# Patient Record
Sex: Female | Born: 1935 | Race: White | Hispanic: No | Smoking: Former smoker
Health system: Southern US, Community
[De-identification: ages and names within clinical notes are randomized; demographics above are authoritative.]

## PROBLEM LIST (undated history)

## (undated) DIAGNOSIS — D649 Anemia, unspecified: Secondary | ICD-10-CM

## (undated) DIAGNOSIS — J310 Chronic rhinitis: Secondary | ICD-10-CM

## (undated) DIAGNOSIS — I10 Essential (primary) hypertension: Secondary | ICD-10-CM

## (undated) DIAGNOSIS — F0390 Unspecified dementia without behavioral disturbance: Secondary | ICD-10-CM

## (undated) DIAGNOSIS — R5383 Other fatigue: Secondary | ICD-10-CM

## (undated) DIAGNOSIS — K589 Irritable bowel syndrome without diarrhea: Secondary | ICD-10-CM

## (undated) DIAGNOSIS — K449 Diaphragmatic hernia without obstruction or gangrene: Secondary | ICD-10-CM

## (undated) DIAGNOSIS — Z853 Personal history of malignant neoplasm of breast: Secondary | ICD-10-CM

## (undated) DIAGNOSIS — F419 Anxiety disorder, unspecified: Secondary | ICD-10-CM

## (undated) DIAGNOSIS — M545 Low back pain, unspecified: Secondary | ICD-10-CM

## (undated) DIAGNOSIS — K5730 Diverticulosis of large intestine without perforation or abscess without bleeding: Secondary | ICD-10-CM

## (undated) DIAGNOSIS — I872 Venous insufficiency (chronic) (peripheral): Secondary | ICD-10-CM

## (undated) DIAGNOSIS — M5030 Other cervical disc degeneration, unspecified cervical region: Secondary | ICD-10-CM

## (undated) DIAGNOSIS — R51 Headache: Secondary | ICD-10-CM

## (undated) DIAGNOSIS — M8580 Other specified disorders of bone density and structure, unspecified site: Secondary | ICD-10-CM

## (undated) HISTORY — DX: Irritable bowel syndrome, unspecified: K58.9

## (undated) HISTORY — PX: OTHER SURGICAL HISTORY: SHX169

## (undated) HISTORY — DX: Diaphragmatic hernia without obstruction or gangrene: K44.9

## (undated) HISTORY — DX: Diverticulosis of large intestine without perforation or abscess without bleeding: K57.30

## (undated) HISTORY — DX: Chronic rhinitis: J31.0

## (undated) HISTORY — DX: Low back pain: M54.5

## (undated) HISTORY — DX: Venous insufficiency (chronic) (peripheral): I87.2

## (undated) HISTORY — DX: Personal history of malignant neoplasm of breast: Z85.3

## (undated) HISTORY — DX: Headache: R51

## (undated) HISTORY — DX: Essential (primary) hypertension: I10

## (undated) HISTORY — DX: Low back pain, unspecified: M54.50

## (undated) HISTORY — DX: Other specified disorders of bone density and structure, unspecified site: M85.80

## (undated) HISTORY — DX: Other fatigue: R53.83

## (undated) HISTORY — DX: Anxiety disorder, unspecified: F41.9

---
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
## 1987-02-11 HISTORY — PX: LUMBAR LAMINECTOMY: SHX95

## 1990-02-10 HISTORY — PX: LAPAROSCOPIC CHOLECYSTECTOMY: SUR755

## 1997-08-31 ENCOUNTER — Ambulatory Visit (HOSPITAL_COMMUNITY): Admission: RE | Admit: 1997-08-31 | Discharge: 1997-08-31 | Payer: Self-pay | Admitting: Obstetrics & Gynecology

## 1997-10-03 ENCOUNTER — Other Ambulatory Visit: Admission: RE | Admit: 1997-10-03 | Discharge: 1997-10-03 | Payer: Self-pay | Admitting: Obstetrics & Gynecology

## 1998-10-04 ENCOUNTER — Encounter: Payer: Self-pay | Admitting: Internal Medicine

## 1998-10-04 ENCOUNTER — Ambulatory Visit (HOSPITAL_COMMUNITY): Admission: RE | Admit: 1998-10-04 | Discharge: 1998-10-04 | Payer: Self-pay | Admitting: Internal Medicine

## 2000-01-14 ENCOUNTER — Ambulatory Visit (HOSPITAL_COMMUNITY): Admission: RE | Admit: 2000-01-14 | Discharge: 2000-01-14 | Payer: Self-pay | Admitting: Obstetrics & Gynecology

## 2000-01-14 ENCOUNTER — Encounter: Payer: Self-pay | Admitting: Obstetrics & Gynecology

## 2000-01-22 ENCOUNTER — Other Ambulatory Visit: Admission: RE | Admit: 2000-01-22 | Discharge: 2000-01-22 | Payer: Self-pay | Admitting: Obstetrics & Gynecology

## 2001-03-22 ENCOUNTER — Encounter: Payer: Self-pay | Admitting: Obstetrics & Gynecology

## 2001-03-22 ENCOUNTER — Ambulatory Visit (HOSPITAL_COMMUNITY): Admission: RE | Admit: 2001-03-22 | Discharge: 2001-03-22 | Payer: Self-pay | Admitting: Obstetrics & Gynecology

## 2001-04-12 ENCOUNTER — Other Ambulatory Visit: Admission: RE | Admit: 2001-04-12 | Discharge: 2001-04-12 | Payer: Self-pay | Admitting: Obstetrics & Gynecology

## 2002-09-08 ENCOUNTER — Encounter: Payer: Self-pay | Admitting: Obstetrics & Gynecology

## 2002-09-08 ENCOUNTER — Ambulatory Visit (HOSPITAL_COMMUNITY): Admission: RE | Admit: 2002-09-08 | Discharge: 2002-09-08 | Payer: Self-pay | Admitting: Obstetrics & Gynecology

## 2002-09-27 ENCOUNTER — Encounter: Payer: Self-pay | Admitting: Pulmonary Disease

## 2002-09-27 ENCOUNTER — Ambulatory Visit (HOSPITAL_COMMUNITY): Admission: RE | Admit: 2002-09-27 | Discharge: 2002-09-27 | Payer: Self-pay | Admitting: Pulmonary Disease

## 2002-12-19 ENCOUNTER — Other Ambulatory Visit: Admission: RE | Admit: 2002-12-19 | Discharge: 2002-12-19 | Payer: Self-pay | Admitting: Obstetrics & Gynecology

## 2003-10-27 ENCOUNTER — Encounter: Admission: RE | Admit: 2003-10-27 | Discharge: 2003-10-27 | Payer: Self-pay | Admitting: Otolaryngology

## 2003-12-27 ENCOUNTER — Ambulatory Visit: Payer: Self-pay | Admitting: Gastroenterology

## 2003-12-27 ENCOUNTER — Ambulatory Visit: Payer: Self-pay | Admitting: Pulmonary Disease

## 2003-12-27 ENCOUNTER — Inpatient Hospital Stay (HOSPITAL_COMMUNITY): Admission: EM | Admit: 2003-12-27 | Discharge: 2004-01-01 | Payer: Self-pay | Admitting: Emergency Medicine

## 2004-01-08 ENCOUNTER — Ambulatory Visit: Payer: Self-pay | Admitting: Pulmonary Disease

## 2004-02-20 ENCOUNTER — Ambulatory Visit: Payer: Self-pay | Admitting: Pulmonary Disease

## 2004-02-20 ENCOUNTER — Other Ambulatory Visit: Admission: RE | Admit: 2004-02-20 | Discharge: 2004-02-20 | Payer: Self-pay | Admitting: Obstetrics & Gynecology

## 2004-05-01 ENCOUNTER — Ambulatory Visit: Payer: Self-pay | Admitting: Pulmonary Disease

## 2004-09-09 ENCOUNTER — Ambulatory Visit: Payer: Self-pay | Admitting: Pulmonary Disease

## 2004-11-28 ENCOUNTER — Ambulatory Visit: Payer: Self-pay | Admitting: Pulmonary Disease

## 2004-12-02 ENCOUNTER — Ambulatory Visit: Payer: Self-pay | Admitting: Cardiology

## 2004-12-18 ENCOUNTER — Ambulatory Visit (HOSPITAL_COMMUNITY): Admission: RE | Admit: 2004-12-18 | Discharge: 2004-12-18 | Payer: Self-pay | Admitting: Gastroenterology

## 2005-01-07 ENCOUNTER — Ambulatory Visit: Payer: Self-pay | Admitting: Pulmonary Disease

## 2005-04-29 ENCOUNTER — Ambulatory Visit (HOSPITAL_COMMUNITY): Admission: RE | Admit: 2005-04-29 | Discharge: 2005-04-29 | Payer: Self-pay | Admitting: Obstetrics & Gynecology

## 2005-05-14 ENCOUNTER — Encounter (INDEPENDENT_AMBULATORY_CARE_PROVIDER_SITE_OTHER): Payer: Self-pay | Admitting: Specialist

## 2005-05-14 ENCOUNTER — Encounter (INDEPENDENT_AMBULATORY_CARE_PROVIDER_SITE_OTHER): Payer: Self-pay | Admitting: Diagnostic Radiology

## 2005-05-14 ENCOUNTER — Encounter: Admission: RE | Admit: 2005-05-14 | Discharge: 2005-05-14 | Payer: Self-pay | Admitting: Obstetrics & Gynecology

## 2005-05-27 ENCOUNTER — Encounter: Admission: RE | Admit: 2005-05-27 | Discharge: 2005-05-27 | Payer: Self-pay | Admitting: Family Medicine

## 2005-06-10 HISTORY — PX: OTHER SURGICAL HISTORY: SHX169

## 2005-06-25 ENCOUNTER — Encounter: Admission: RE | Admit: 2005-06-25 | Discharge: 2005-06-25 | Payer: Self-pay | Admitting: Surgery

## 2005-06-26 ENCOUNTER — Encounter (INDEPENDENT_AMBULATORY_CARE_PROVIDER_SITE_OTHER): Payer: Self-pay | Admitting: *Deleted

## 2005-06-26 ENCOUNTER — Encounter: Admission: RE | Admit: 2005-06-26 | Discharge: 2005-06-26 | Payer: Self-pay | Admitting: Surgery

## 2005-06-26 ENCOUNTER — Ambulatory Visit (HOSPITAL_BASED_OUTPATIENT_CLINIC_OR_DEPARTMENT_OTHER): Admission: RE | Admit: 2005-06-26 | Discharge: 2005-06-26 | Payer: Self-pay | Admitting: Surgery

## 2005-06-26 ENCOUNTER — Encounter (INDEPENDENT_AMBULATORY_CARE_PROVIDER_SITE_OTHER): Payer: Self-pay | Admitting: Surgery

## 2005-06-27 ENCOUNTER — Ambulatory Visit: Payer: Self-pay | Admitting: Oncology

## 2005-06-27 ENCOUNTER — Ambulatory Visit: Admission: RE | Admit: 2005-06-27 | Discharge: 2005-08-15 | Payer: Self-pay | Admitting: *Deleted

## 2005-07-16 LAB — CBC WITH DIFFERENTIAL/PLATELET
Basophils Absolute: 0 10*3/uL (ref 0.0–0.1)
EOS%: 8.5 % — ABNORMAL HIGH (ref 0.0–7.0)
Eosinophils Absolute: 0.8 10*3/uL — ABNORMAL HIGH (ref 0.0–0.5)
HGB: 13.1 g/dL (ref 11.6–15.9)
MCH: 31.1 pg (ref 26.0–34.0)
NEUT#: 4.8 10*3/uL (ref 1.5–6.5)
RBC: 4.23 10*6/uL (ref 3.70–5.32)
RDW: 12.8 % (ref 11.3–14.5)
lymph#: 2.8 10*3/uL (ref 0.9–3.3)

## 2005-07-16 LAB — COMPREHENSIVE METABOLIC PANEL
AST: 20 U/L (ref 0–37)
Albumin: 4 g/dL (ref 3.5–5.2)
Alkaline Phosphatase: 61 U/L (ref 39–117)
BUN: 7 mg/dL (ref 6–23)
Calcium: 9.1 mg/dL (ref 8.4–10.5)
Chloride: 105 mEq/L (ref 96–112)
Potassium: 4 mEq/L (ref 3.5–5.3)
Sodium: 139 mEq/L (ref 135–145)
Total Protein: 6.9 g/dL (ref 6.0–8.3)

## 2005-07-16 LAB — CANCER ANTIGEN 27.29: CA 27.29: 15 U/mL (ref 0–39)

## 2005-08-12 ENCOUNTER — Encounter: Admission: RE | Admit: 2005-08-12 | Discharge: 2005-08-12 | Payer: Self-pay | Admitting: Oncology

## 2005-08-18 ENCOUNTER — Ambulatory Visit: Payer: Self-pay | Admitting: Pulmonary Disease

## 2005-09-01 ENCOUNTER — Ambulatory Visit: Payer: Self-pay | Admitting: Oncology

## 2005-10-29 ENCOUNTER — Ambulatory Visit: Payer: Self-pay | Admitting: Pulmonary Disease

## 2005-10-29 ENCOUNTER — Ambulatory Visit (HOSPITAL_COMMUNITY): Admission: RE | Admit: 2005-10-29 | Discharge: 2005-10-29 | Payer: Self-pay | Admitting: Pulmonary Disease

## 2005-11-03 ENCOUNTER — Encounter: Admission: RE | Admit: 2005-11-03 | Discharge: 2005-11-03 | Payer: Self-pay | Admitting: Pulmonary Disease

## 2005-11-13 ENCOUNTER — Ambulatory Visit (HOSPITAL_COMMUNITY): Admission: RE | Admit: 2005-11-13 | Discharge: 2005-11-13 | Payer: Self-pay | Admitting: Neurosurgery

## 2005-11-13 ENCOUNTER — Encounter: Payer: Self-pay | Admitting: Vascular Surgery

## 2005-11-25 ENCOUNTER — Ambulatory Visit: Payer: Self-pay | Admitting: Oncology

## 2005-11-28 ENCOUNTER — Ambulatory Visit: Payer: Self-pay | Admitting: Pulmonary Disease

## 2006-01-20 ENCOUNTER — Ambulatory Visit: Payer: Self-pay | Admitting: Pulmonary Disease

## 2006-02-24 ENCOUNTER — Ambulatory Visit: Payer: Self-pay | Admitting: Oncology

## 2006-02-27 LAB — CBC WITH DIFFERENTIAL/PLATELET
BASO%: 0.4 % (ref 0.0–2.0)
Basophils Absolute: 0 10*3/uL (ref 0.0–0.1)
HCT: 37.5 % (ref 34.8–46.6)
HGB: 12.7 g/dL (ref 11.6–15.9)
MONO#: 0.8 10*3/uL (ref 0.1–0.9)
NEUT%: 62.5 % (ref 39.6–76.8)
RDW: 12.2 % (ref 11.3–14.5)
WBC: 11.8 10*3/uL — ABNORMAL HIGH (ref 3.9–10.0)
lymph#: 3.2 10*3/uL (ref 0.9–3.3)

## 2006-02-27 LAB — LACTATE DEHYDROGENASE: LDH: 154 U/L (ref 94–250)

## 2006-02-27 LAB — COMPREHENSIVE METABOLIC PANEL
ALT: 13 U/L (ref 0–35)
Albumin: 3.8 g/dL (ref 3.5–5.2)
BUN: 10 mg/dL (ref 6–23)
CO2: 21 mEq/L (ref 19–32)
Calcium: 8.8 mg/dL (ref 8.4–10.5)
Chloride: 105 mEq/L (ref 96–112)
Creatinine, Ser: 0.87 mg/dL (ref 0.40–1.20)
Potassium: 4 mEq/L (ref 3.5–5.3)

## 2006-05-04 ENCOUNTER — Encounter: Admission: RE | Admit: 2006-05-04 | Discharge: 2006-05-04 | Payer: Self-pay | Admitting: Surgery

## 2006-06-12 ENCOUNTER — Ambulatory Visit: Payer: Self-pay | Admitting: Pulmonary Disease

## 2006-06-24 ENCOUNTER — Ambulatory Visit: Payer: Self-pay | Admitting: Oncology

## 2006-06-26 LAB — COMPREHENSIVE METABOLIC PANEL
ALT: 24 U/L (ref 0–35)
AST: 16 U/L (ref 0–37)
Albumin: 3.9 g/dL (ref 3.5–5.2)
Alkaline Phosphatase: 44 U/L (ref 39–117)
Glucose, Bld: 104 mg/dL — ABNORMAL HIGH (ref 70–99)
Potassium: 4 mEq/L (ref 3.5–5.3)
Sodium: 139 mEq/L (ref 135–145)
Total Bilirubin: 0.4 mg/dL (ref 0.3–1.2)
Total Protein: 6.6 g/dL (ref 6.0–8.3)

## 2006-06-26 LAB — CBC WITH DIFFERENTIAL/PLATELET
BASO%: 0.8 % (ref 0.0–2.0)
EOS%: 0.8 % (ref 0.0–7.0)
Eosinophils Absolute: 0.1 10*3/uL (ref 0.0–0.5)
LYMPH%: 26.2 % (ref 14.0–48.0)
MCHC: 34.6 g/dL (ref 32.0–36.0)
MCV: 89.7 fL (ref 81.0–101.0)
MONO%: 7 % (ref 0.0–13.0)
NEUT#: 9.2 10*3/uL — ABNORMAL HIGH (ref 1.5–6.5)
Platelets: 279 10*3/uL (ref 145–400)
RBC: 4.19 10*6/uL (ref 3.70–5.32)
RDW: 13.1 % (ref 11.3–14.5)

## 2006-06-26 LAB — CANCER ANTIGEN 27.29: CA 27.29: 14 U/mL (ref 0–39)

## 2006-11-24 ENCOUNTER — Ambulatory Visit: Payer: Self-pay | Admitting: Oncology

## 2007-02-16 ENCOUNTER — Ambulatory Visit: Payer: Self-pay | Admitting: Oncology

## 2007-02-18 ENCOUNTER — Encounter: Payer: Self-pay | Admitting: Pulmonary Disease

## 2007-02-18 LAB — COMPREHENSIVE METABOLIC PANEL
AST: 11 U/L (ref 0–37)
BUN: 8 mg/dL (ref 6–23)
CO2: 22 mEq/L (ref 19–32)
Calcium: 8.7 mg/dL (ref 8.4–10.5)
Chloride: 102 mEq/L (ref 96–112)
Creatinine, Ser: 0.89 mg/dL (ref 0.40–1.20)
Total Bilirubin: 0.3 mg/dL (ref 0.3–1.2)

## 2007-02-18 LAB — CBC WITH DIFFERENTIAL/PLATELET
BASO%: 0.6 % (ref 0.0–2.0)
Basophils Absolute: 0.1 10*3/uL (ref 0.0–0.1)
EOS%: 1 % (ref 0.0–7.0)
HCT: 39.9 % (ref 34.8–46.6)
HGB: 13.7 g/dL (ref 11.6–15.9)
LYMPH%: 25.9 % (ref 14.0–48.0)
MCH: 31.3 pg (ref 26.0–34.0)
MCHC: 34.3 g/dL (ref 32.0–36.0)
MCV: 91.4 fL (ref 81.0–101.0)
NEUT%: 68.1 % (ref 39.6–76.8)
Platelets: 368 10*3/uL (ref 145–400)
lymph#: 3.6 10*3/uL — ABNORMAL HIGH (ref 0.9–3.3)

## 2007-02-18 LAB — LACTATE DEHYDROGENASE: LDH: 168 U/L (ref 94–250)

## 2007-03-04 LAB — VITAMIN D PNL(25-HYDRXY+1,25-DIHY)-BLD
Vit D, 1,25-Dihydroxy: 51 pg/mL (ref 6–62)
Vit D, 25-Hydroxy: 18 ng/mL — ABNORMAL LOW (ref 30–89)

## 2007-03-05 DIAGNOSIS — M545 Low back pain: Secondary | ICD-10-CM

## 2007-03-05 DIAGNOSIS — I872 Venous insufficiency (chronic) (peripheral): Secondary | ICD-10-CM | POA: Insufficient documentation

## 2007-03-05 DIAGNOSIS — I10 Essential (primary) hypertension: Secondary | ICD-10-CM | POA: Insufficient documentation

## 2007-03-05 DIAGNOSIS — J45909 Unspecified asthma, uncomplicated: Secondary | ICD-10-CM | POA: Insufficient documentation

## 2007-04-02 ENCOUNTER — Ambulatory Visit: Payer: Self-pay | Admitting: Physical Medicine and Rehabilitation

## 2007-04-02 ENCOUNTER — Encounter
Admission: RE | Admit: 2007-04-02 | Discharge: 2007-07-01 | Payer: Self-pay | Admitting: Physical Medicine and Rehabilitation

## 2007-05-05 ENCOUNTER — Encounter: Admission: RE | Admit: 2007-05-05 | Discharge: 2007-05-05 | Payer: Self-pay | Admitting: Oncology

## 2007-05-05 ENCOUNTER — Ambulatory Visit: Payer: Self-pay | Admitting: Physical Medicine and Rehabilitation

## 2007-05-25 ENCOUNTER — Ambulatory Visit: Payer: Self-pay | Admitting: Pulmonary Disease

## 2007-06-07 ENCOUNTER — Ambulatory Visit: Payer: Self-pay | Admitting: Physical Medicine and Rehabilitation

## 2007-06-09 ENCOUNTER — Ambulatory Visit: Payer: Self-pay | Admitting: Oncology

## 2007-06-11 LAB — CBC WITH DIFFERENTIAL/PLATELET
Eosinophils Absolute: 0.2 10*3/uL (ref 0.0–0.5)
HCT: 40 % (ref 34.8–46.6)
LYMPH%: 29.3 % (ref 14.0–48.0)
MONO#: 0.7 10*3/uL (ref 0.1–0.9)
NEUT#: 6.2 10*3/uL (ref 1.5–6.5)
Platelets: 354 10*3/uL (ref 145–400)
RBC: 4.45 10*6/uL (ref 3.70–5.32)
WBC: 10.1 10*3/uL — ABNORMAL HIGH (ref 3.9–10.0)

## 2007-06-14 ENCOUNTER — Encounter
Admission: RE | Admit: 2007-06-14 | Discharge: 2007-07-08 | Payer: Self-pay | Admitting: Physical Medicine and Rehabilitation

## 2007-06-14 LAB — CANCER ANTIGEN 27.29: CA 27.29: 20 U/mL (ref 0–39)

## 2007-06-14 LAB — COMPREHENSIVE METABOLIC PANEL
Alkaline Phosphatase: 64 U/L (ref 39–117)
BUN: 8 mg/dL (ref 6–23)
Creatinine, Ser: 0.73 mg/dL (ref 0.40–1.20)
Glucose, Bld: 71 mg/dL (ref 70–99)
Total Bilirubin: 0.4 mg/dL (ref 0.3–1.2)

## 2007-06-14 LAB — VITAMIN D 25 HYDROXY (VIT D DEFICIENCY, FRACTURES): Vit D, 25-Hydroxy: 16 ng/mL — ABNORMAL LOW (ref 30–89)

## 2007-06-14 LAB — LACTATE DEHYDROGENASE: LDH: 187 U/L (ref 94–250)

## 2007-06-18 ENCOUNTER — Encounter: Payer: Self-pay | Admitting: Pulmonary Disease

## 2007-06-21 ENCOUNTER — Ambulatory Visit: Payer: Self-pay | Admitting: Physical Medicine and Rehabilitation

## 2007-07-08 ENCOUNTER — Encounter
Admission: RE | Admit: 2007-07-08 | Discharge: 2007-07-09 | Payer: Self-pay | Admitting: Physical Medicine & Rehabilitation

## 2007-07-09 ENCOUNTER — Ambulatory Visit: Payer: Self-pay | Admitting: Physical Medicine and Rehabilitation

## 2007-08-16 ENCOUNTER — Encounter: Admission: RE | Admit: 2007-08-16 | Discharge: 2007-08-16 | Payer: Self-pay | Admitting: Oncology

## 2007-11-04 IMAGING — CR DG CHEST 2V
2 series · 2 of 2 positions shown · non-contrast
Comparison: none

CLINICAL DATA: Pre-op right breast surgery.  Patient has some cough and congestion.
 CHEST - 2 VIEW:

[view not recorded (1 of 2)]
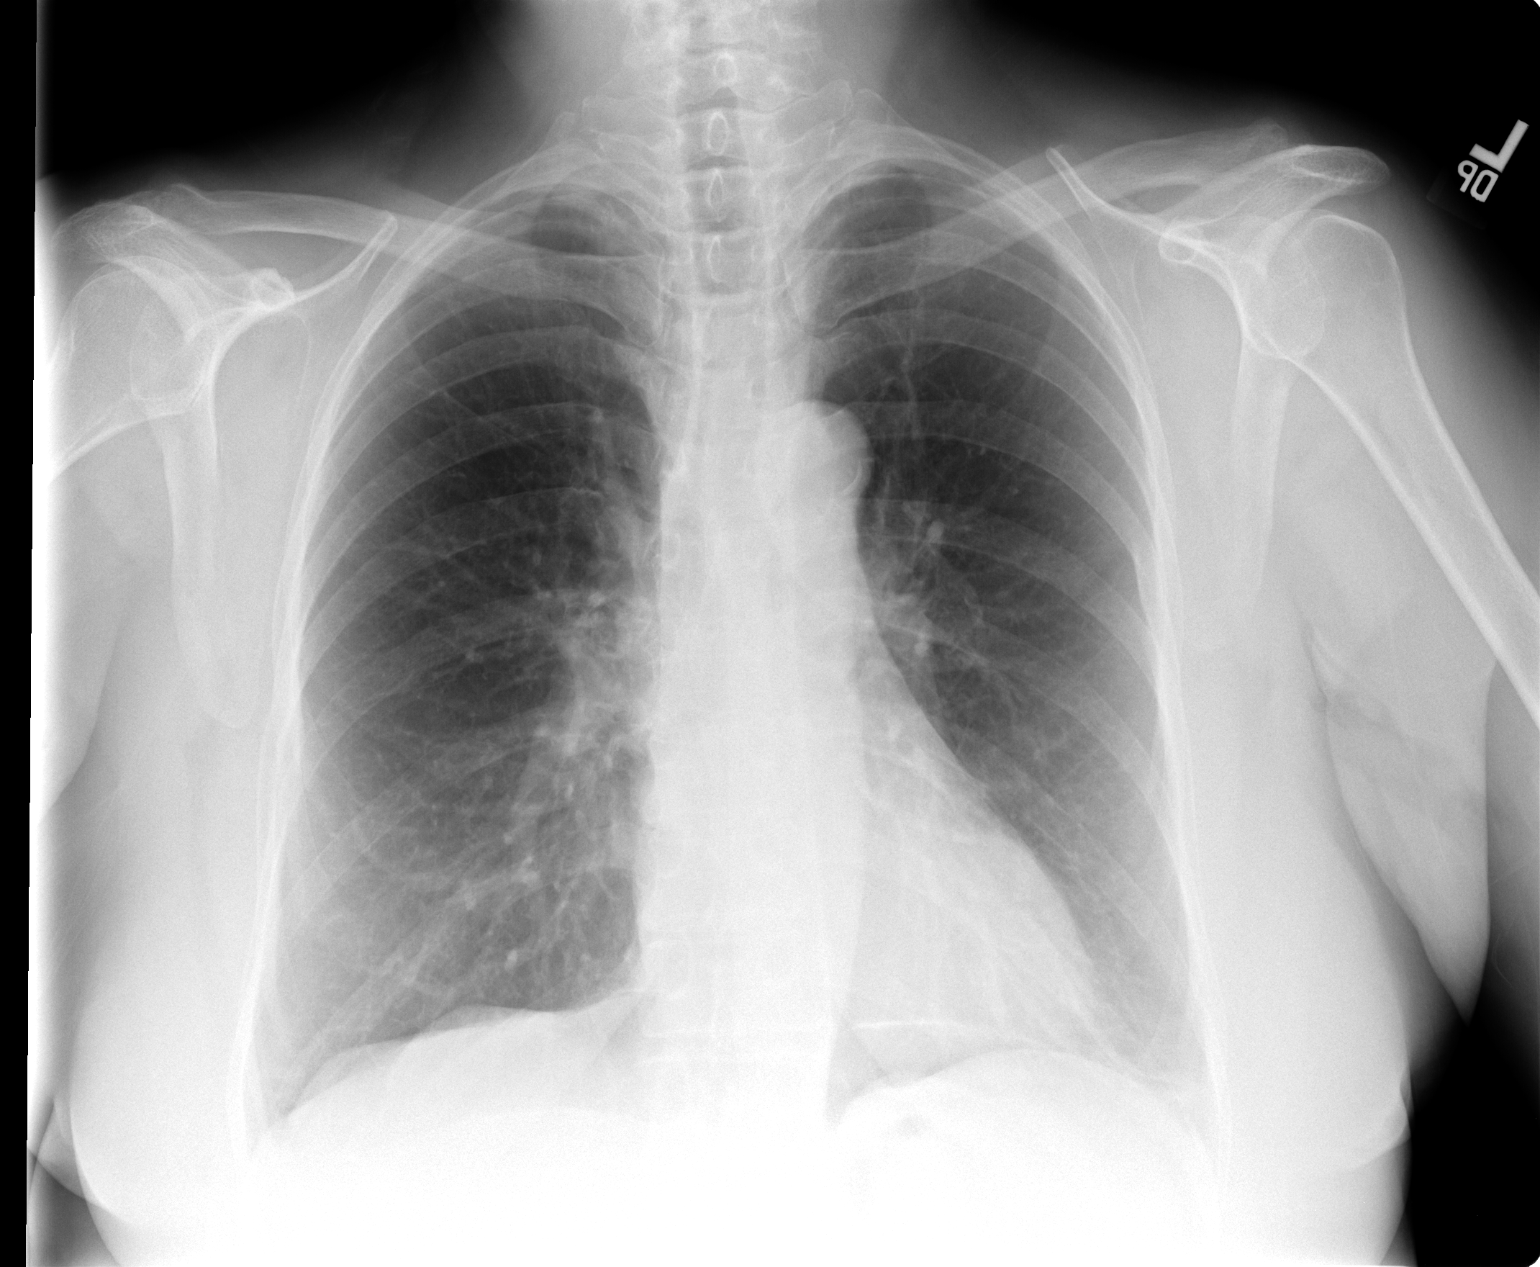

[view not recorded (2 of 2)]
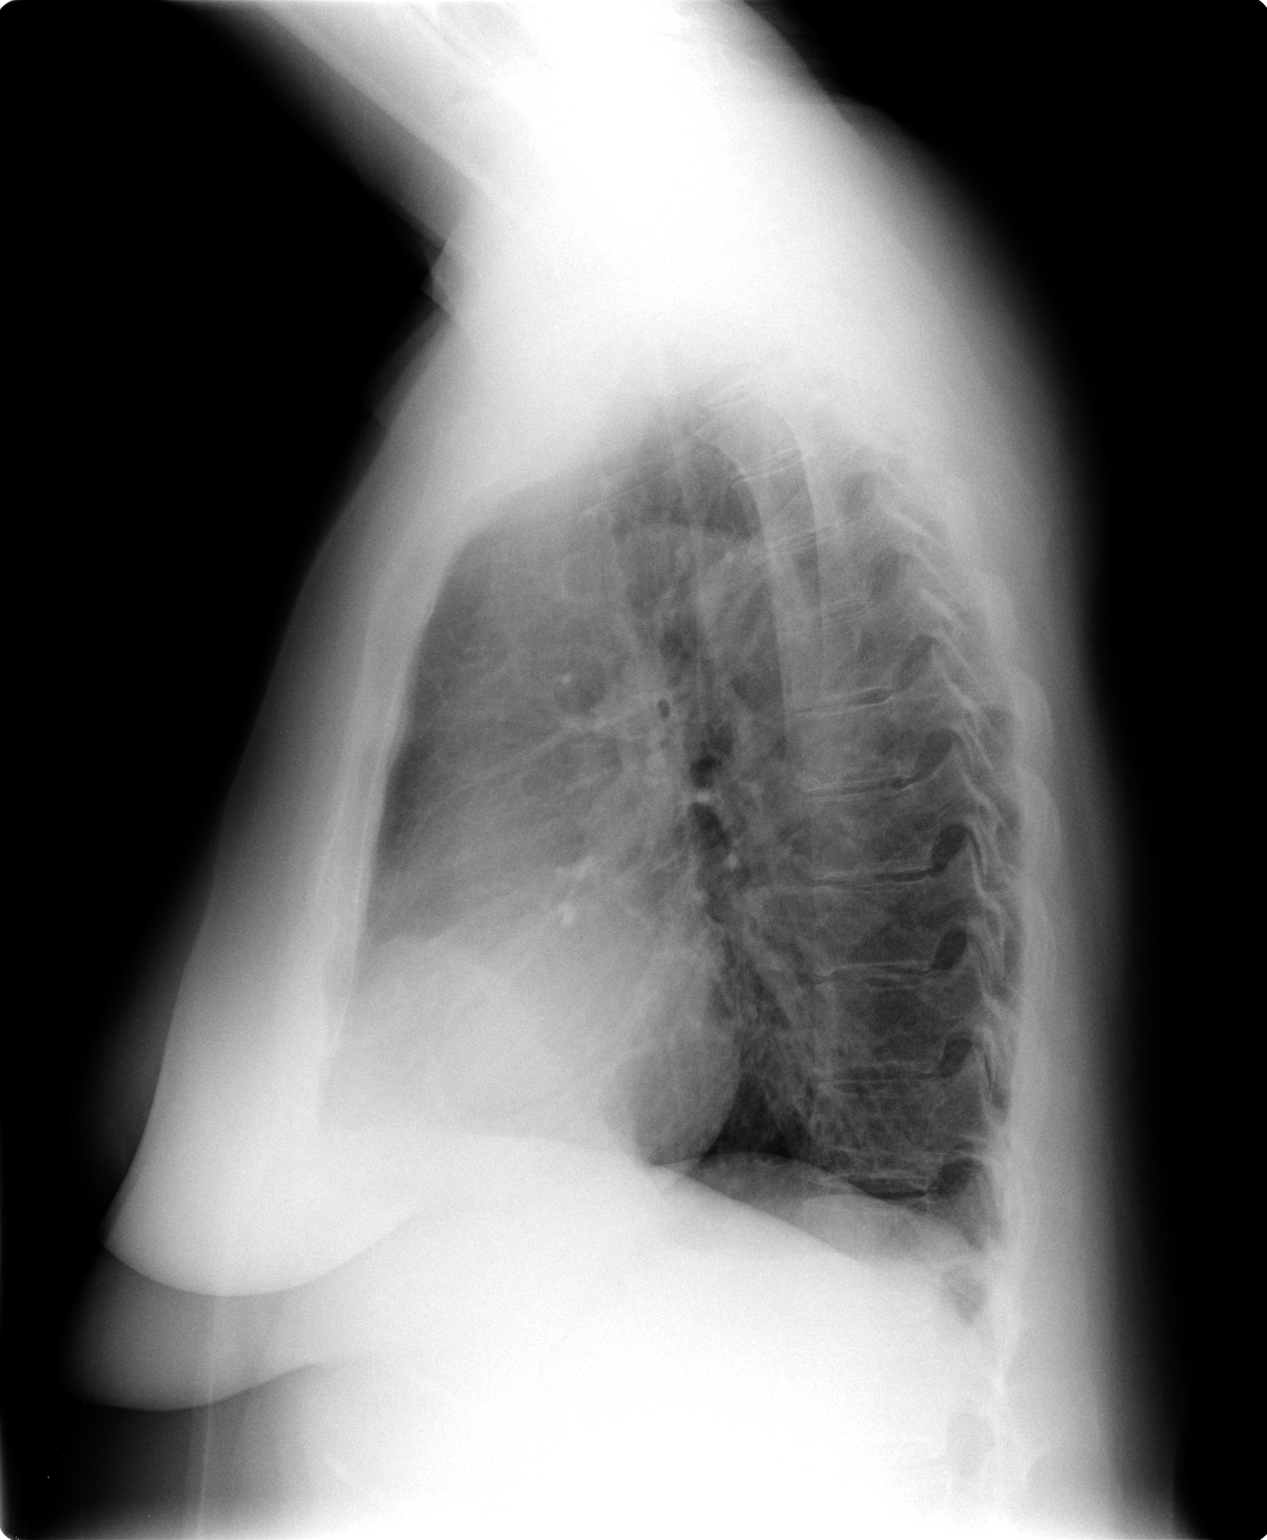

[2 of 2 positions shown; findings below may reference images not displayed]

FINDINGS: PA and lateral views of the chest are made and are compared to a previous study of 12/27/03 and show again mild diffuse peribronchial thickening, flattening of the hemidiaphragms, and some increase in the AP diameter of the chest.  There is also what appears to be a stable scar at the left lung base. The heart and mediastinum are within the normal limits.  The bony thorax appears normal.
IMPRESSION: Mild COPD.  Stable atelectasis and/or scarring left base as compared to previous study of 12/27/03.  No acute abnormality is seen.

## 2007-12-22 ENCOUNTER — Ambulatory Visit: Payer: Self-pay | Admitting: Oncology

## 2007-12-24 LAB — CBC WITH DIFFERENTIAL/PLATELET
BASO%: 1 % (ref 0.0–2.0)
Basophils Absolute: 0.1 10*3/uL (ref 0.0–0.1)
EOS%: 2.2 % (ref 0.0–7.0)
HGB: 13.2 g/dL (ref 11.6–15.9)
MCH: 30.1 pg (ref 26.0–34.0)
MCHC: 33.9 g/dL (ref 32.0–36.0)
MCV: 88.7 fL (ref 81.0–101.0)
MONO%: 5.7 % (ref 0.0–13.0)
NEUT%: 62.6 % (ref 39.6–76.8)
RDW: 13.1 % (ref 11.3–14.5)

## 2007-12-27 LAB — COMPREHENSIVE METABOLIC PANEL
ALT: 14 U/L (ref 0–35)
AST: 13 U/L (ref 0–37)
Creatinine, Ser: 0.8 mg/dL (ref 0.40–1.20)
Total Bilirubin: 0.4 mg/dL (ref 0.3–1.2)

## 2007-12-27 LAB — LACTATE DEHYDROGENASE: LDH: 195 U/L (ref 94–250)

## 2007-12-31 ENCOUNTER — Encounter: Payer: Self-pay | Admitting: Pulmonary Disease

## 2008-04-20 ENCOUNTER — Encounter: Payer: Self-pay | Admitting: Adult Health

## 2008-04-20 ENCOUNTER — Ambulatory Visit: Payer: Self-pay | Admitting: Pulmonary Disease

## 2008-04-20 DIAGNOSIS — J309 Allergic rhinitis, unspecified: Secondary | ICD-10-CM | POA: Insufficient documentation

## 2008-04-25 LAB — CONVERTED CEMR LAB
BUN: 9 mg/dL (ref 6–23)
Bacteria, UA: NEGATIVE
Basophils Absolute: 0.1 10*3/uL (ref 0.0–0.1)
Calcium: 9.2 mg/dL (ref 8.4–10.5)
Crystals: NEGATIVE
Eosinophils Absolute: 0.2 10*3/uL (ref 0.0–0.7)
Eosinophils Relative: 1.8 % (ref 0.0–5.0)
GFR calc Af Amer: 91 mL/min
Glucose, Bld: 78 mg/dL (ref 70–99)
HCT: 37.9 % (ref 36.0–46.0)
Hemoglobin, Urine: NEGATIVE
MCHC: 34.3 g/dL (ref 30.0–36.0)
MCV: 88.2 fL (ref 78.0–100.0)
Monocytes Absolute: 0.8 10*3/uL (ref 0.1–1.0)
Platelets: 347 10*3/uL (ref 150–400)
RDW: 12.2 % (ref 11.5–14.6)
Sodium: 142 meq/L (ref 135–145)
Urine Glucose: NEGATIVE mg/dL
Urobilinogen, UA: 0.2 (ref 0.0–1.0)
WBC: 11.6 10*3/uL — ABNORMAL HIGH (ref 4.5–10.5)

## 2008-05-11 ENCOUNTER — Encounter: Admission: RE | Admit: 2008-05-11 | Discharge: 2008-05-11 | Payer: Self-pay | Admitting: Oncology

## 2008-06-27 ENCOUNTER — Ambulatory Visit: Payer: Self-pay | Admitting: Oncology

## 2008-06-29 LAB — COMPREHENSIVE METABOLIC PANEL
ALT: 12 U/L (ref 0–35)
Albumin: 4 g/dL (ref 3.5–5.2)
CO2: 24 mEq/L (ref 19–32)
Chloride: 103 mEq/L (ref 96–112)
Glucose, Bld: 130 mg/dL — ABNORMAL HIGH (ref 70–99)
Potassium: 4.3 mEq/L (ref 3.5–5.3)
Sodium: 138 mEq/L (ref 135–145)
Total Bilirubin: 0.3 mg/dL (ref 0.3–1.2)
Total Protein: 6.8 g/dL (ref 6.0–8.3)

## 2008-06-29 LAB — CBC WITH DIFFERENTIAL/PLATELET
BASO%: 0.5 % (ref 0.0–2.0)
Eosinophils Absolute: 0.3 10*3/uL (ref 0.0–0.5)
LYMPH%: 27.2 % (ref 14.0–49.7)
MCHC: 33.9 g/dL (ref 31.5–36.0)
MONO#: 0.6 10*3/uL (ref 0.1–0.9)
NEUT#: 6.4 10*3/uL (ref 1.5–6.5)
RBC: 4.02 10*6/uL (ref 3.70–5.45)
RDW: 13.6 % (ref 11.2–14.5)
WBC: 10.1 10*3/uL (ref 3.9–10.3)
lymph#: 2.7 10*3/uL (ref 0.9–3.3)

## 2008-06-29 LAB — LACTATE DEHYDROGENASE: LDH: 194 U/L (ref 94–250)

## 2008-07-06 ENCOUNTER — Encounter: Payer: Self-pay | Admitting: Pulmonary Disease

## 2008-07-19 ENCOUNTER — Ambulatory Visit: Payer: Self-pay | Admitting: Pulmonary Disease

## 2008-07-19 DIAGNOSIS — C50919 Malignant neoplasm of unspecified site of unspecified female breast: Secondary | ICD-10-CM | POA: Insufficient documentation

## 2008-07-19 DIAGNOSIS — K5730 Diverticulosis of large intestine without perforation or abscess without bleeding: Secondary | ICD-10-CM | POA: Insufficient documentation

## 2008-07-19 DIAGNOSIS — K589 Irritable bowel syndrome without diarrhea: Secondary | ICD-10-CM

## 2008-07-19 DIAGNOSIS — K449 Diaphragmatic hernia without obstruction or gangrene: Secondary | ICD-10-CM | POA: Insufficient documentation

## 2008-12-29 ENCOUNTER — Ambulatory Visit: Payer: Self-pay | Admitting: Oncology

## 2009-10-09 ENCOUNTER — Ambulatory Visit: Payer: Self-pay | Admitting: Oncology

## 2009-10-17 ENCOUNTER — Inpatient Hospital Stay (HOSPITAL_COMMUNITY)
Admission: EM | Admit: 2009-10-17 | Discharge: 2009-10-26 | Payer: Self-pay | Source: Home / Self Care | Admitting: Emergency Medicine

## 2009-10-17 ENCOUNTER — Encounter: Payer: Self-pay | Admitting: Family Medicine

## 2009-10-18 ENCOUNTER — Ambulatory Visit: Payer: Self-pay | Admitting: Psychiatry

## 2009-10-19 ENCOUNTER — Encounter: Payer: Self-pay | Admitting: Family Medicine

## 2009-10-22 ENCOUNTER — Encounter: Payer: Self-pay | Admitting: Family Medicine

## 2009-10-24 ENCOUNTER — Encounter: Payer: Self-pay | Admitting: Family Medicine

## 2009-10-26 ENCOUNTER — Encounter: Payer: Self-pay | Admitting: Family Medicine

## 2009-11-09 ENCOUNTER — Encounter: Payer: Self-pay | Admitting: Family Medicine

## 2009-11-12 ENCOUNTER — Ambulatory Visit: Payer: Self-pay | Admitting: Family Medicine

## 2009-11-12 DIAGNOSIS — F068 Other specified mental disorders due to known physiological condition: Secondary | ICD-10-CM

## 2009-11-12 DIAGNOSIS — D518 Other vitamin B12 deficiency anemias: Secondary | ICD-10-CM

## 2009-11-12 DIAGNOSIS — D508 Other iron deficiency anemias: Secondary | ICD-10-CM | POA: Insufficient documentation

## 2009-11-13 ENCOUNTER — Telehealth (INDEPENDENT_AMBULATORY_CARE_PROVIDER_SITE_OTHER): Payer: Self-pay | Admitting: *Deleted

## 2009-11-13 LAB — CONVERTED CEMR LAB
Basophils Relative: 0.1 % (ref 0.0–3.0)
CO2: 24 meq/L (ref 19–32)
Chloride: 99 meq/L (ref 96–112)
Eosinophils Absolute: 0.2 10*3/uL (ref 0.0–0.7)
Hemoglobin: 11.6 g/dL — ABNORMAL LOW (ref 12.0–15.0)
Lymphocytes Relative: 27.1 % (ref 12.0–46.0)
MCHC: 32 g/dL (ref 30.0–36.0)
MCV: 73.9 fL — ABNORMAL LOW (ref 78.0–100.0)
Monocytes Absolute: 0.8 10*3/uL (ref 0.1–1.0)
Neutro Abs: 6.4 10*3/uL (ref 1.4–7.7)
RBC: 4.9 M/uL (ref 3.87–5.11)
Saturation Ratios: 6.9 % — ABNORMAL LOW (ref 20.0–50.0)
Sodium: 134 meq/L — ABNORMAL LOW (ref 135–145)

## 2009-11-14 ENCOUNTER — Ambulatory Visit: Payer: Self-pay | Admitting: Oncology

## 2009-11-30 ENCOUNTER — Encounter (INDEPENDENT_AMBULATORY_CARE_PROVIDER_SITE_OTHER): Payer: Self-pay | Admitting: *Deleted

## 2009-12-21 ENCOUNTER — Ambulatory Visit: Payer: Self-pay | Admitting: Oncology

## 2009-12-26 ENCOUNTER — Ambulatory Visit: Payer: Self-pay | Admitting: Psychiatry

## 2010-03-02 ENCOUNTER — Encounter: Payer: Self-pay | Admitting: Obstetrics & Gynecology

## 2010-03-03 ENCOUNTER — Encounter: Payer: Self-pay | Admitting: Pulmonary Disease

## 2010-03-03 ENCOUNTER — Encounter: Payer: Self-pay | Admitting: Oncology

## 2010-03-05 ENCOUNTER — Ambulatory Visit (HOSPITAL_COMMUNITY)
Admission: RE | Admit: 2010-03-05 | Discharge: 2010-03-05 | Payer: Self-pay | Source: Home / Self Care | Attending: Psychiatry | Admitting: Psychiatry

## 2010-03-14 NOTE — Miscellaneous (Signed)
Summary: [employer]  [employer]   Imported By: Lanelle Bal 11/19/2009 12:49:25  _____________________________________________________________________  External Attachment:    Type:   Image     Comment:   External Document

## 2010-03-14 NOTE — Letter (Signed)
Summary: [facility]  [facility]   Imported By: Lanelle Bal 11/19/2009 12:14:46  _____________________________________________________________________  External Attachment:    Type:   Image     Comment:   External Document

## 2010-03-14 NOTE — Letter (Signed)
Summary: Unable To Reach-Consult Scheduled  [facility] [place]  [address] [place], [place] [address]   Phone: [phone]  Fax: [phone]    11/30/2009 MRN: [id number]    Dear Ms. Daye,   We have been unable to reach you by phone.  There are some things we need to discuss from your most recent labs.  Please contact our office to discuss.    Thank you,  Dr. Jim Desanctis at [facility]

## 2010-03-14 NOTE — Letter (Signed)
Summary: [facility]  [facility]   Imported By: Lanelle Bal 11/19/2009 12:21:39  _____________________________________________________________________  External Attachment:    Type:   Image     Comment:   External Document

## 2010-03-14 NOTE — Assessment & Plan Note (Signed)
Summary: [name], DEMENTIA///SPH   Vital Signs:  Patient profile:   77 year old female Height:      66 inches Weight:      134.8 pounds BMI:     21.84 Pulse rate:   64 / minute Pulse rhythm:   regular BP sitting:   150 / 90  (left arm) Cuff size:   regular  Vitals Entered By: Almeta Monas CMA Duncan Dull) (November 12, 2009 2:07 PM) CC: hospt f/u Flu Vaccine Consent Questions     Do you have a history of severe allergic reactions to this vaccine? no    Any prior history of allergic reactions to egg and/or gelatin? no    Do you have a sensitivity to the preservative Thimersol? no    Do you have a past history of Guillan-Barre Syndrome? no    Do you currently have an acute febrile illness? no    Have you ever had a severe reaction to latex? no    Vaccine information given and explained to patient? yes    Are you currently pregnant? no    Lot Number:[id number]   Exp Date:08/10/2010   Site Given  Left Deltoid IM   History of Present Illness: Pt here to establish and hospital f/u.  Her daughter is with her.   Pt was in hospital with anemia , sob and fatigue.   Hospital records reviewed.    Current Medications (verified): 1)  Zyrtec Allergy 10 Mg Tabs (Cetirizine Hcl) .... Take 1 Tab By Mouth Once Daily in The Am... 2)  Flonase 50 Mcg/act Susp (Fluticasone Propionate) .... 2 Sp in Each Nostril At Bedtime.Marland KitchenMarland Kitchen 3)  Mucinex Maximum Strength 1200 Mg Xr12h-Tab (Guaifenesin) .... Take 1 Tab By Mouth Two Times A Day W/ Plenty of Fluids.Marland KitchenMarland Kitchen 4)  Advair Diskus 500-50 Mcg/dose Misc (Fluticasone-Salmeterol) .... Inhale 1 Puff Two Times A Day 5)  Lopressor 50 Mg  Tabs (Metoprolol Tartrate) .... Take 1/2 Tab By Mouth Two Times A Day 6)  Megestrol Acetate 40 Mg Tabs (Megestrol Acetate) .... Take 1 Tablet By Mouth Once A Day 7)  Caltrate 600+d 600-400 Mg-Unit Tabs (Calcium Carbonate-Vitamin D) .... Take 1 Tab By Mouth Two Times A Day... 8)  Womens Multivitamin Plus  Tabs (Multiple  Vitamins-Minerals) .... Take 1 Tab Daily.Marland KitchenMarland Kitchen 9)  Vitamin D 1000 Unit Caps (Cholecalciferol) .... Take 1 Cap By Mouth Once Daily... 10)  Protonix 40 Mg Solr (Pantoprazole Sodium) .... By Mouth Two Times A Day 11)  Alprazolam 0.25 Mg Tabs (Alprazolam) .Marland Kitchen.. 1 By Mouth Qhs 12)  Zoloft 50 Mg Tabs (Sertraline Hcl) .... 1/2 Tab By Mouth Once Daily For 8 Days Then 1 By Mouth Once Daily  Allergies (verified): 1)  ! Levaquin  Past History:  Past medical, surgical, family and social histories (including risk factors) reviewed for relevance to current acute and chronic problems.  Past Medical History: Reviewed history from 07/19/2008 and no changes required. ALLERGIC RHINITIS (ICD-477.9) CHRONIC RHINITIS (ICD-472.0) ASTHMA (ICD-493.90) HYPERTENSION (ICD-401.9) VENOUS INSUFFICIENCY (ICD-459.81) HIATAL HERNIA (ICD-553.3) IRRITABLE BOWEL SYNDROME (ICD-564.1) DIVERTICULOSIS OF COLON (ICD-562.10) Hx of BREAST CANCER (ICD-174.9) LOW BACK PAIN SYNDROME (ICD-724.2) OSTEOPENIA (ICD-733.90) FATIGUE (ICD-780.79) HEADACHE (ICD-784.0) ANXIETY (ICD-300.00)  Past Surgical History: Reviewed history from 07/19/2008 and no changes required. S/P appendectomy in the 1950's S/P right inguinal hernia repair S/P lumbar laminectomy in 1989 by DrAplington S/P lap cholecystectomy in 1992 by DrBowman S/P right breast lumpectomy & sentinel node bx 5/07 by DrStreck  Family History: Reviewed history and no changes  required.  Social History: Reviewed history and no changes required. lives at the [facility] living with her dogs   Review of Systems      See HPI  Physical Exam  General:  well-developed, well-nourished, and well-hydrated. pale  pleasantly mildly demented   Neck:  No deformities, masses, or tenderness noted. Lungs:  Normal respiratory effort, chest expands symmetrically. Lungs are clear to auscultation, no crackles or wheezes. Heart:  normal rate and Grade 1-2  /6 systolic ejection  murmur.   Abdomen:  Bowel sounds positive,abdomen soft and non-tender without masses, organomegaly or hernias noted. Psych:  moderately anxious, memory impairment, and judgment poor.     Impression & Recommendations:  Problem # 1:  DEMENTIA (ICD-294.8)  Orders: Venipuncture (14782) TLB-B12 + Folate Pnl (95621_30865-H84/ONG) TLB-IBC Pnl (Iron/FE;Transferrin) (83550-IBC) TLB-BMP (Basic Metabolic Panel-BMET) (80048-METABOL) TLB-CBC Platelet - w/Differential (85025-CBCD) T-Vitamin D (25-Hydroxy) (29528-41324) TLB-Ferritin (82728-FER) Psychiatric Referral (Psych) Specimen Handling (40102)  Problem # 2:  ANEMIA, IRON DEFICIENCY, MICROCYTIC (ICD-280.8)  Orders: Venipuncture (72536) TLB-B12 + Folate Pnl (64403_47425-Z56/LOV) TLB-IBC Pnl (Iron/FE;Transferrin) (83550-IBC) TLB-BMP (Basic Metabolic Panel-BMET) (80048-METABOL) TLB-CBC Platelet - w/Differential (85025-CBCD) T-Vitamin D (25-Hydroxy) (56433-29518) TLB-Ferritin (82728-FER) Gastroenterology Referral (GI) Specimen Handling (84166)  Problem # 3:  ANEMIA, B12 DEFICIENCY (ICD-281.1)  Orders: Venipuncture (06301) TLB-B12 + Folate Pnl (60109_32355-D32/KGU) TLB-IBC Pnl (Iron/FE;Transferrin) (83550-IBC) TLB-BMP (Basic Metabolic Panel-BMET) (80048-METABOL) TLB-CBC Platelet - w/Differential (85025-CBCD) T-Vitamin D (25-Hydroxy) (54270-62376) TLB-Ferritin (82728-FER) Specimen Handling (28315) Admin of Therapeutic Inj  intramuscular or subcutaneous (17616) Vit B12 1000 mcg (J3420)  Hgb: 13.0 (04/20/2008)   Hct: 37.9 (04/20/2008)   Platelets: 347 (04/20/2008) RBC: 4.29 (04/20/2008)   RDW: 12.2 (04/20/2008)   WBC: 11.6 (04/20/2008) MCV: 88.2 (04/20/2008)   MCHC: 34.3 (04/20/2008) TSH: 1.23 (04/20/2008)  Problem # 4:  HYPERTENSION (ICD-401.9)  Her updated medication list for this problem includes:    Lopressor 50 Mg Tabs (Metoprolol tartrate) .Marland Kitchen... Take 1/2 tab by mouth two times a day  Orders: Venipuncture (07371) TLB-B12  + Folate Pnl (06269_48546-E70/JJK) TLB-IBC Pnl (Iron/FE;Transferrin) (83550-IBC) TLB-BMP (Basic Metabolic Panel-BMET) (80048-METABOL) TLB-CBC Platelet - w/Differential (85025-CBCD) T-Vitamin D (25-Hydroxy) (09381-82993) TLB-Ferritin (82728-FER) Specimen Handling (71696)  BP today: 150/90 Prior BP: 122/80 (07/19/2008)  Labs Reviewed: K+: 4.0 (04/20/2008) Creat: : 0.8 (04/20/2008)     Complete Medication List: 1)  Zyrtec Allergy 10 Mg Tabs (Cetirizine hcl) .... Take 1 tab by mouth once daily in the am... 2)  Flonase 50 Mcg/act Susp (Fluticasone propionate) .... 2 sp in each nostril at bedtime.Marland KitchenMarland Kitchen 3)  Mucinex Maximum Strength 1200 Mg Xr12h-tab (Guaifenesin) .... Take 1 tab by mouth two times a day w/ plenty of fluids.Marland KitchenMarland Kitchen 4)  Advair Diskus 500-50 Mcg/dose Misc (Fluticasone-salmeterol) .... Inhale 1 puff two times a day 5)  Lopressor 50 Mg Tabs (Metoprolol tartrate) .... Take 1/2 tab by mouth two times a day 6)  Megestrol Acetate 40 Mg Tabs (Megestrol acetate) .... Take 1 tablet by mouth once a day 7)  Caltrate 600+d 600-400 Mg-unit Tabs (Calcium carbonate-vitamin d) .... Take 1 tab by mouth two times a day... 8)  Womens Multivitamin Plus Tabs (Multiple vitamins-minerals) .... Take 1 tab daily.Marland KitchenMarland Kitchen 9)  Vitamin D 1000 Unit Caps (Cholecalciferol) .... Take 1 cap by mouth once daily... 10)  Protonix 40 Mg Solr (Pantoprazole sodium) .... By mouth two times a day 11)  Alprazolam 0.25 Mg Tabs (Alprazolam) .Marland Kitchen.. 1 by mouth qhs 12)  Zoloft 50 Mg Tabs (Sertraline hcl) .... 1/2 tab by mouth once daily for 8 days then 1 by mouth once  daily  Other Orders: Flu Vaccine 81yrs + MEDICARE PATIENTS (Y7829) Administration Flu vaccine - MCR (G0008) Pneumococcal Vaccine (56213) Admin 1st Vaccine (08657)  Patient Instructions: 1)  rto next week for b 12--- she will need weekly b12 x3 ( first one given today) then monthly injections 2)  recheck B12 level 1 month Prescriptions: ZOLOFT 50 MG TABS (SERTRALINE HCL)  1/2 tab by mouth once daily for 8 days then 1 by mouth once daily  #30 x 2   Entered and Authorized by:   Loreen Freud DO   Signed by:   Loreen Freud DO on 11/12/2009   Method used:   Faxed to ...       [facility] (retail)       [phone]       [place], [place]  [address]       Ph: [phone]       Fax: [phone]   RxID:   [id number] ADVAIR DISKUS 500-50 MCG/DOSE MISC (FLUTICASONE-SALMETEROL) Inhale 1 puff two times a day  #1 x 5   Entered and Authorized by:   Loreen Freud DO   Signed by:   Loreen Freud DO on 11/12/2009   Method used:   Faxed to ...       [facility] (retail)       [phone]       [place], [place]  [address]       Ph: [phone]       Fax: [phone]   RxID:   [id number] FLONASE 50 MCG/ACT SUSP (FLUTICASONE PROPIONATE) 2 sp in each nostril at bedtime...  #1 x 11   Entered and Authorized by:   Loreen Freud DO   Signed by:   Loreen Freud DO on 11/12/2009   Method used:   Faxed to ...       [facility] (retail)       [phone]       [place], [place]  [address]       Ph: [phone]       Fax: [phone]   RxID:   [id number]    Immunizations Administered:  Pneumonia Vaccine:    Vaccine Type: Pneumovax (Medicare)    Site: right deltoid    Mfr: Merck    Dose: 0.5 ml    Route: IM    Given by: Almeta Monas CMA (AAMA)    Exp. Date: 04/25/2011    Lot #: [id number]    VIS given: 01/15/09 version given November 12, 2009.   Immunizations Administered:  Pneumonia Vaccine:    Vaccine Type: Pneumovax (Medicare)    Site: right deltoid    Mfr: Merck    Dose: 0.5 ml    Route: IM    Given by: Almeta Monas CMA (AAMA)    Exp. Date: 04/25/2011    Lot #: [id number]    VIS given: 01/15/09 version given November 12, 2009.   Medication Administration  Injection # 1:    Medication: Vit B12 1000 mcg    Diagnosis: ANEMIA, B12 DEFICIENCY (ICD-281.1)    Route: IM    Site: L  deltoid    Exp Date: 04/11/2011    Lot #: [id number]    Mfr: [employer]    Patient tolerated injection without complications    Given by: Almeta Monas CMA Duncan Dull) (November 12, 2009 2:43 PM)  Orders Added: 1)  Venipuncture [36415] 2)  TLB-B12 + Folate Pnl [82746_82607-B12/FOL] 3)  TLB-IBC Pnl (Iron/FE;Transferrin) [83550-IBC] 4)  TLB-BMP (Basic Metabolic Panel-BMET) [80048-METABOL] 5)  TLB-CBC Platelet - w/Differential [85025-CBCD] 6)  T-Vitamin D (25-Hydroxy) [16109-60454] 7)  TLB-Ferritin [82728-FER] 8)  Psychiatric Referral [Psych] 9)  Gastroenterology Referral [GI] 10)  Specimen Handling [99000] 11)  Flu Vaccine 45yrs + MEDICARE PATIENTS [Q2039] 12)  Administration Flu vaccine - MCR [G0008] 13)  Pneumococcal Vaccine [90732] 14)  Admin 1st Vaccine [90471] 15)  Admin of Therapeutic Inj  intramuscular or subcutaneous [96372] 16)  Vit B12 1000 mcg [J3420] 17)  Est. Patient Level IV [09811]

## 2010-03-14 NOTE — Progress Notes (Signed)
Summary: labs-no answer no machine-will try backx2  Phone Note Outgoing Call   Call placed by: Doristine Devoid CMA,  November 13, 2009 3:35 PM Summary of Call: con't B12 injections hbg low---take slow Fe  1 by mouth once daily  platlets are high---we need to repeat CBCD when she comes in next week for b12---288.8  low---increase vita D 3 2000u daily take vita D 50,000u  weekly  #4  2 refills recheck 3 months  Follow-up for Phone Call        tried calling patient no answer no machine will try back....Marland KitchenMarland KitchenDoristine Devoid CMA  November 13, 2009 3:36 PM   tried calling patient again no answer no machine ..........Marland KitchenDoristine Devoid CMA  November 20, 2009 4:47 PM   will mail copy of labs and have patient contact office to discuss.........Marland KitchenDoristine Devoid CMA  November 27, 2009 9:35 AM

## 2010-03-21 ENCOUNTER — Telehealth: Payer: Self-pay | Admitting: Family Medicine

## 2010-03-27 ENCOUNTER — Encounter: Payer: Self-pay | Admitting: Family Medicine

## 2010-03-28 NOTE — Progress Notes (Signed)
Summary: Results  Phone Note From Other Clinic   Caller: Dr Maryland Pink Call For: Loreen Freud Summary of Call: Pt hgb 8.9.  He is faxing over here.  Pt was supposed to have f/u with [facility].   Did she ever go? Initial call taken by: Loreen Freud DO,  March 21, 2010 1:02 PM  Follow-up for Phone Call        spoke with patient and she stated she never followed up with [facility], and she is not taking any Fe supplements or MVI.  Follow-up by: Almeta Monas CMA Duncan Dull),  March 21, 2010 1:33 PM  Additional Follow-up for Phone Call Additional follow up Details #1::        she needs to take the iron and needs asap appointment with GI---- if they can't see here quickly she will need to come in here. Additional Follow-up by: Loreen Freud DO,  March 21, 2010 1:43 PM    Additional Follow-up for Phone Call Additional follow up Details #2::    pt aware of the above and I advised we will get her scheduled again with the GI doctor... Phone note forwarded to [facility] to make aware to schedule appt with GI. Follow-up by: Almeta Monas CMA Duncan Dull),  March 21, 2010 3:46 PM  Additional Follow-up for Phone Call Additional follow up Details #3:: Details for Additional Follow-up Action Taken: PATIENT IS SCHEDULED TO SEE DR. Madilyn Fireman [facility] 03-27-2010 ARRIVE 2:30AM (HIS 1ST AVAIL) I INFORMED PATIENT. Magdalen Spatz [facility]  March 22, 2010 4:09  Dr Donell Beers informed as well   03/22/2010  430p yrlowne

## 2010-04-23 NOTE — Consult Note (Signed)
Summary: [facility]  [facility]   Imported By: Maryln Gottron 04/17/2010 09:33:51  _____________________________________________________________________  External Attachment:    Type:   Image     Comment:   External Document

## 2010-04-25 LAB — CBC
HCT: 37.5 % (ref 36.0–46.0)
HCT: 25.6 % — ABNORMAL LOW (ref 36.0–46.0)
HCT: 34.6 % — ABNORMAL LOW (ref 36.0–46.0)
Hemoglobin: 11.4 g/dL — ABNORMAL LOW (ref 12.0–15.0)
Hemoglobin: 10.5 g/dL — ABNORMAL LOW (ref 12.0–15.0)
Hemoglobin: 5 g/dL — CL (ref 12.0–15.0)
MCH: 21.6 pg — ABNORMAL LOW (ref 26.0–34.0)
MCH: 14.9 pg — ABNORMAL LOW (ref 26.0–34.0)
MCH: 15.2 pg — ABNORMAL LOW (ref 26.0–34.0)
MCHC: 30.4 g/dL (ref 30.0–36.0)
MCHC: 25.4 g/dL — ABNORMAL LOW (ref 30.0–36.0)
MCHC: 26 g/dL — ABNORMAL LOW (ref 30.0–36.0)
MCHC: 28.5 g/dL — ABNORMAL LOW (ref 30.0–36.0)
MCHC: 30.3 g/dL (ref 30.0–36.0)
MCV: 58.7 fL — ABNORMAL LOW (ref 78.0–100.0)
MCV: 63.1 fL — ABNORMAL LOW (ref 78.0–100.0)
MCV: 70.2 fL — ABNORMAL LOW (ref 78.0–100.0)
Platelets: 350 10*3/uL (ref 150–400)
Platelets: 376 10*3/uL (ref 150–400)
Platelets: 382 10*3/uL (ref 150–400)
RDW: 20.6 % — ABNORMAL HIGH (ref 11.5–15.5)
RDW: 20.7 % — ABNORMAL HIGH (ref 11.5–15.5)
RDW: 25.7 % — ABNORMAL HIGH (ref 11.5–15.5)
RDW: 29.7 % — ABNORMAL HIGH (ref 11.5–15.5)
WBC: 9.4 10*3/uL (ref 4.0–10.5)

## 2010-04-25 LAB — VITAMIN B12: Vitamin B-12: 188 pg/mL — ABNORMAL LOW (ref 211–911)

## 2010-04-25 LAB — TYPE AND SCREEN
ABO/RH(D): AB POS
Antibody Screen: NEGATIVE

## 2010-04-25 LAB — DIFFERENTIAL
Basophils Relative: 2 % — ABNORMAL HIGH (ref 0–1)
Eosinophils Absolute: 0.3 10*3/uL (ref 0.0–0.7)
Eosinophils Relative: 3 % (ref 0–5)
Lymphocytes Relative: 30 % (ref 12–46)
Neutrophils Relative %: 55 % (ref 43–77)

## 2010-04-25 LAB — FOLATE: Folate: 15.4 ng/mL

## 2010-04-25 LAB — COMPREHENSIVE METABOLIC PANEL
Albumin: 3.7 g/dL (ref 3.5–5.2)
BUN: 10 mg/dL (ref 6–23)
Creatinine, Ser: 0.68 mg/dL (ref 0.4–1.2)
Total Bilirubin: 0.2 mg/dL — ABNORMAL LOW (ref 0.3–1.2)
Total Protein: 6.7 g/dL (ref 6.0–8.3)

## 2010-04-25 LAB — URINALYSIS, MICROSCOPIC ONLY
Bilirubin Urine: NEGATIVE
Leukocytes, UA: NEGATIVE
Nitrite: NEGATIVE
Specific Gravity, Urine: 1.024 (ref 1.005–1.030)
Urobilinogen, UA: 1 mg/dL (ref 0.0–1.0)
pH: 6.5 (ref 5.0–8.0)

## 2010-04-25 LAB — PROTIME-INR
INR: 1.14 (ref 0.00–1.49)
Prothrombin Time: 14.8 seconds (ref 11.6–15.2)

## 2010-04-25 LAB — HEMOGLOBIN AND HEMATOCRIT, BLOOD
HCT: 23.2 % — ABNORMAL LOW (ref 36.0–46.0)
HCT: 25.4 % — ABNORMAL LOW (ref 36.0–46.0)
HCT: 34.8 % — ABNORMAL LOW (ref 36.0–46.0)
HCT: 35.6 % — ABNORMAL LOW (ref 36.0–46.0)
Hemoglobin: 10.8 g/dL — ABNORMAL LOW (ref 12.0–15.0)
Hemoglobin: 6.7 g/dL — CL (ref 12.0–15.0)
Hemoglobin: 7.3 g/dL — ABNORMAL LOW (ref 12.0–15.0)

## 2010-04-25 LAB — ABO/RH: ABO/RH(D): AB POS

## 2010-04-25 LAB — BASIC METABOLIC PANEL
BUN: 10 mg/dL (ref 6–23)
BUN: 6 mg/dL (ref 6–23)
BUN: 7 mg/dL (ref 6–23)
CO2: 25 mEq/L (ref 19–32)
Chloride: 106 mEq/L (ref 96–112)
Chloride: 105 mEq/L (ref 96–112)
Creatinine, Ser: 0.6 mg/dL (ref 0.4–1.2)
Creatinine, Ser: 0.72 mg/dL (ref 0.4–1.2)
GFR calc non Af Amer: >60 mL/min (ref >60)
GFR calc non Af Amer: >60 mL/min (ref >60)
Glucose, Bld: 106 mg/dL — ABNORMAL HIGH (ref 70–99)
Glucose, Bld: 105 mg/dL — ABNORMAL HIGH (ref 70–99)
Glucose, Bld: 96 mg/dL (ref 70–99)
Potassium: 3.5 mEq/L (ref 3.5–5.1)
Potassium: 3.8 mEq/L (ref 3.5–5.1)
Sodium: 136 mEq/L (ref 135–145)

## 2010-04-25 LAB — POCT I-STAT, CHEM 8
Calcium, Ion: 1.13 mmol/L (ref 1.12–1.32)
Creatinine, Ser: 0.6 mg/dL (ref 0.4–1.2)
Glucose, Bld: 110 mg/dL — ABNORMAL HIGH (ref 70–99)
HCT: 21 % — ABNORMAL LOW (ref 36.0–46.0)
Hemoglobin: 7.1 g/dL — ABNORMAL LOW (ref 12.0–15.0)
TCO2: 20 mmol/L (ref 0–100)

## 2010-04-25 LAB — HEMOCCULT GUIAC POC 1CARD (OFFICE): Fecal Occult Bld: NEGATIVE

## 2010-04-25 LAB — TSH: TSH: 0.988 u[IU]/mL (ref 0.350–4.500)

## 2010-04-25 LAB — RPR: RPR Ser Ql: NONREACTIVE

## 2010-06-20 ENCOUNTER — Other Ambulatory Visit: Payer: Self-pay | Admitting: Family Medicine

## 2010-06-20 NOTE — Telephone Encounter (Signed)
This Rx is filled by Dr.Nadel not Dr.Lowne, I will forward to him---  KP

## 2010-06-25 NOTE — Assessment & Plan Note (Signed)
[name] is a pleasant 77 year old patient who was referred by Dr.  Pierce Crane.  She is back in today for a recheck after participating in  a physical therapy program to work on strengthening of the right lower  extremity which was noted to be somewhat weaker than the left lower  extremity at the last visit.   She has done well in therapy, and reports overall improvement in pain as  well as improvement in her gait.   She states that when she initially took Neurontin 100 mg two tablets  three times a day she had some initial dizziness, but at this time she  does not have any problems with dizziness and reports overall  improvement in her ability to stand and walk.   She is able to walk her dog twice a day between 30 and 60 minutes each  time.   She is able to climb stairs, and she currently is driving.   Her average pain is about a 5 on a scale of 10.  Pain is typically worse  with walking.  Improves with rest, heat, and medication.  She is  reporting fair relief with current meds at this time.   REVIEW OF SYSTEMS:  Otherwise, noncontributory.  She has had no further  vaginal bleeding.   No changes in her Past Medical or Social or Family History since her  last visit with [place].   MEDICATIONS:  She takes from this clinic include Neurontin 100 mg two  tablets p.o. t.i.d.   PHYSICAL EXAMINATION:  VITAL SIGNS:  Blood pressure 140/92, pulse 78,  respirations 18, 100% saturated on room air.  GENERAL:  She is a well-developed, well-nourished female who appears her  stated age, and she does not appear in any distress.  NEUROLOGICAL:  She is oriented x3.  Her speech is clear. Her affect is  bright.  She is alert, cooperative, and pleasant, and she follows  commands without difficulty.   She transitions from sitting to standing with ease.  Her gait in the  room is asymmetric, and she tends to have decreased weightbearing in the  right lower extremity during her gait cycle.  Motor  strength in the  right lower extremity is overall improved from last visit.  She has 5/5  strength at right hip flexors and left hip flexors, bilateral knee  extensors are 5/5, dorsiflexors are 5/5, plantar flexors are 5/5.   She has no abnormal tone.  No clonus is noted.  She has limitations in  lumbar motion in all planes.   IMPRESSION:  1. Lumbar spondylosis with multilevel degenerative changes in the      disk, facets with short pedicles.  2. Lumbago and right leg pain with ambulation overall improved.  3. Medical problems include history of breast cancer, history of      chronic obstructive pulmonary disease, history of rapid heart rate,      and history of diverticulitis.   PLAN:  Will refill prescription for her on Neurontin 100 mg two tablets  p.o. t.i.d., and she may trial q.i.d. if she does not have problems with  dizziness #240 with one refill.  Will see her back in 4-5 weeks.  Will  send her to therapy one more time for a TENS unit trial, and encourage  her to continue her exercises which were given to her  last month in physical therapy.  [name] is stable on the above  medication.  She is not experiencing any dizziness  at this time, and  overall has improved her activity level and pain level.           ______________________________  Brantley Stage, M.D.     DMK/MedQ  D:  07/09/2007 12:32:51  T:  07/09/2007 13:43:26  Job #:  [id number]   cc:   Pierce Crane, MD  Fax: [phone]

## 2010-06-25 NOTE — Assessment & Plan Note (Signed)
[name] is a very pleasant 77 year old divorced female who was  referred by Dr. Pierce Weeks.   CHIEF COMPLAINT:  Low back pain and initially bilateral leg pain  currently.  She is back in today for a recheck.  She was started on a  low dose of Neurontin last month which was titrated up quite slowly.  Her initial starting dose was 100 mg and she was on 300 mg a day in  divided doses as of last week.   She states last week she also developed some vaginal bleeding, was seen  by gynecologist.  She states the bleeding has now stopped.  However, she  also has discontinued her Neurontin.   Her pain has not changed significantly.  She continues to have low back  pain and radiating leg pain which is worse with walking.  She can walk  about 1 hour at this time, however, she needs to use a cane and she does  so very slowly.  Her pain interferes very little with general activity  or enjoyment of life.   She states her sleep is good.  She is currently not on any medications  from this clinic at this time as she discontinued her Neurontin about a  week ago due to the vaginal bleeding.   Functional status is quite good. She is independent with all of her self  care and higher level activities.  She reports no problems with respect  to bladder or bowel control problems.  She denies any persistent  numbness, tingling.  She denies depression, anxiety or suicidal  ideation.   REVIEW OF SYSTEMS:  Otherwise non-contributory other than recent vaginal  bleeding.   Past medical history, social history are unchanged from her previous  visit.   PHYSICAL EXAMINATION:  VITALS:  Blood pressure is 187/92, pulse 68,  respirations 18, 96% saturated on room air.  GENERAL:  She is an elderly female who is well-developed and well-  nourished and does not appear in any distress.  She is oriented x3.  Her  speech is clear.  Her affect is bright.  She is alert, cooperative and  pleasant.  She follows commands  easily.  She transitions from sitting to  standing with ease.  EXTREMITIES:  She has slight antalgic gait with decreased weight bearing  right lower extremity.  Limitations are noted in lumbar forward flexion,  extension as well as lateral flexion.  Her coordination, balance are  intact.  Her motor strength is 5/5 in the hip flexors, knee extensors,  dorsiflexors, plantar flexors.  Reflexes are 2+ at the patellar and  Achilles tendons.  There is no abnormal tone noted. No clonus is noted.  SKIN:  Negative for any evidence of petechia or rash.   IMPRESSION:  1. New vaginal bleeding, most likely this is not related to her      Neurontin.  Would suspect Arimidex as a possible reason for this.  2. Degenerative lumbar spondylosis associated with degenerative disk      disease, degenerative facet disease, multi level spinal, lateral      and foraminal stenosis.  3. Mild trochanteric bursitis on the right.  4. Mild right Achilles tenonitis.   MEDICAL PROBLEMS:  Include the following:  1. History of breast cancer.  2. History of COPD.  3. History or rapid heartbeat.  4. History of diverticulitis.   PLAN:  Will obtain CBC with diff and proton.  Will notify Dr. [facility]  clinic regarding her vaginal bleeding.  Will hold off on reinstating  Neurontin until after blood work is obtained.   This woman has symptoms of spinal stenosis, would like to get her  started on Neurontin again at some point as well as get her in a  physical therapy program.  Will hold off until the vaginal bleeding  issue is sorted out a bit.  May also consider a trial of Lyrica if she  is reluctant to trial Neurontin again.  I do not believe the Neurontin  is really the cause of this vaginal bleeding.  Will see her back in a  month.   Incidentally Dr. Renelda Loma clinic was called, spoke with Mervyn Skeeters at  the oncology clinic who stated she would notify them regarding vaginal  bleeding.            ______________________________  Brantley Stage, M.D.     DMK/MedQ  D:  05/10/2007 08:14:55  T:  05/10/2007 09:03:36  Job #:  [id number]   cc:   [name]. Courtney Basque, MD  [address]  [place]  [place] [address]   Courtney Crane, MD  Fax: [phone]

## 2010-06-25 NOTE — Assessment & Plan Note (Signed)
Ms. Estey is back in today. She is a pleasant, 77 year old, divorced  female who is a patient of Dr. Pierce Crane.   She is back in today for recheck. She had been started on Neurontin last  visit and had been  on a couple of weeks and she developed some vaginal  bleeding and was checked by her gynecologist. We did a CBC with a pro  time, hopefully normal pro time and normal bleeding time and INR were  noted. Hemoglobin and hematocrit were within normal limits as well.   No further problems were reported by the patient. I am not sure whether  this may have been a complications from her Arimidex. She is going to be  following up with Dr. Donnie Coffin in the next couple of days.   She is back in today and would like to retrial the Neurontin. She has  stopped it now for several weeks.   Her average pain is about a 5 on a scale of 10. Her pain typically  occurs when she is standing and walking. The pain goes through the right  buttock down the right leg. The pain is improved as she can bend forward  such as walking on a grocery cart in the store. Sleep is good, the pain  does not bother her much at night.   The amount of time she can walk is quite variable. She is able to climb  stairs, she is able to drive.   FUNCTIONAL STATUS:  She is living independently. She is independent with  her self care, feeding, dressing, bathing, toileting, meal prep.   REVIEW OF SYSTEMS:  Otherwise negative, no further vaginal bleeding.   Past medical, family, social, family history are unchanged from last  visit.   PHYSICAL EXAMINATION:  Today blood pressure is 159/82, pulse 71,  respirations 18, 94% saturated on room air.  She is a well-developed, well-nourished, female who appears her stated  age. She is oriented x3. Speech is clear, affect is bright. She is  alert, cooperative and pleasant. She follows commands easily.   Transitioning from sitting to standing is done with ease in the room.  Her gait is  quite antalgic. Decreased weightbearing to the right lower  extremity. The patient may have some weakness in his leg as well.   On formal exam, her  reflexes are symmetric and intact in the patella  and Achilles tendon. Straight leg raise is negative.   She has about 4/5 strength with right hip flexion and she has about 4/5  strength with knee flexion. Dorsiflexion and plantar flexion are not  compromised on manual muscle testing however she does have a difficult  time doing a toe raise on the right side compared to the left side  today. No abnormal tone is noted, no clonus is noted.   No pain down the thoracic or lumbar spine with palpation.   Abduction on the right is half a grade weaker than abduction on the left  at the hip.   IMPRESSION:  1. Right low back pain and leg pain with ambulation.  2. Degenerative spondylosis associated with degenerative disk disease,      degenerative facet disease, multilevel spinal lateral foraminal      stenosis with short pedicles.  3. Mild right trochanteric bursitis and Achilles tendonitis is not an      issue at this time.   MEDICAL PROBLEMS:  History of breast cancer, history of COPD, history of  rapid heart rate and  history of diverticulitis.   PLAN:  Will retrial her on Neurontin. She will discontinue immediately  if she has any further bleeding 100 mg one tablet at night for 3 days  and then b.i.d. for 3 days and t.i.d. I will see her back in 10 days to  2 weeks for a brief visit. May titrate it up at that time. Will also get  her scheduled for physical therapy to address weakness around the hip  and knee. Assess her for orthotic and for system device as well. Will  see her back in 2 weeks and then after her physical therapy is  completed.   We also discussed the possibility of following up with the surgeon. She  brings this up and would like to consider it if she is not functionally  improving after some medications and some  rehabilitations.           ______________________________  Brantley Stage, M.D.     DMK/MedQ  D:  06/07/2007 10:50:12  T:  06/07/2007 11:14:06  Job #:  [id number]   cc:   Pierce Crane, MD  Fax: [phone]

## 2010-06-25 NOTE — Assessment & Plan Note (Signed)
[name] is back in today for a brief recheck.   She was started on Neurontin again on April 27th, and she is taking 100  mg three times a day.  She is back in today and states that she has no  problems with drowsiness.  She is not sure that the medicine is working  quite yet.   She continues to have pain in the right lower extremity, especially in  the anterior tibial region and medially.  Her average pain is about a 5  on a scale of 10.  Her pain is influenced by ambulation significantly.  She is able to walk at least an hour at a time.  She is able to climb  stairs, and she is able to drive.   She is independent with her self care.  She lives alone.  She denies  problems controlling bowel or bladder.  She denies depression, anxiety,  or suicidal ideation.   No other changes in past medical, social, or family history since her  last visit.   MEDICATIONS:  Provided by this clinic include Gabapentin 100 mg three  times a day.   PHYSICAL EXAMINATION:  VITAL SIGNS:  Blood pressure is 144/68, pulse 66,  respirations 18, 98% saturated on room air.  GENERAL:  She is a well developed, well nourished female who appears her  stated age.  NEUROLOGIC:  She is oriented x3.  Speech is clear.  Affect is bright.  She is alert, cooperative, and pleasant.  She follows commands without  difficulty.  MUSCULOSKELETAL:  Transitioning from sitting-to-standing is done without  much difficulty.  She does seem a little bit stiff as she walks in the  room without a cane.  Her gait is significantly antalgic with very  little weightbearing in the right lower extremity as she steps onto that  right side.  With a cane she does even out her gait quite significantly,  however.  Her reflexes are diminished at the patellar and Achilles  tendons.  No abnormal tone is noted.  No clonus is noted.  Her motor  strength is overall somewhat improved from last visit.  She has 4+/5  strength in the right hip flexion  and 4+/5 strength with knee flexion,  seems a bit improved from last month.  The rest of her exam is  unchanged.  She has 5/5 throughout the rest of her left lower extremity  as well as her right lower extremity.  She has tenderness to palpation  at the distal tibia on the right and somewhat medially, exquisitely  tender in fact as a laboratory tuning fork placed over this tibial  region causes quite a bit of discomfort compared to the left leg, quite  sensitive to even mild compression in this area.   IMPRESSION:  1. Low back pain and right leg pain with ambulation.  2. Degenerative spondylosis associated with degenerative disk disease,      degenerative facet disease, multilevel spinal lateral foraminal      stenosis with short pedicles.  3. Mild trochanteric bursitis and Achilles tendonitis that are      currently not a problem at this time.   MEDICAL PROBLEMS:  1. History of breast cancer.  2. History of chronic obstructive pulmonary disease.  3. History of rapid heart rate.  4. History of diverticulitis.   PLAN:  1. We will increase her Neurontin slightly to 100 mg two tablets p.o.      t.i.d. x3 days and then q.i.d.  2. We will obtain a radiograph of the right lower extremity.  3. I will see her back in 2 more weeks.           ______________________________  [name], M.D.     DMK/MedQ  D:  06/21/2007 14:24:34  T:  06/21/2007 15:25:43  Job #:  [id number]

## 2010-06-25 NOTE — Group Therapy Note (Signed)
[name] is a very pleasant divorced white female who was kindly  referred by Dr. Pierce Crane.  Ms. Kujala come to our pain and  rehabilitative clinic with an approximately 2 year history of increasing  low back pain and bilateral leg pain.   She states that over this period of time she has noted that especially  with standing and walking her low back bothers her and again with  standing and walking pain goes down both lower extremities predominantly  the right one, however.  She states she has been using a cane for about  a year now to prevent falling.  She states her average pain is about a 3  on a scale of 10.  Most of her pain is in the right calf when she is  standing and walking, however.  Morning is particularly difficult time  for her as she gets up from her bed.  She sleeps quite well, however,  she needs to sleep on her side curled up.  She is unable to lie flat  because of pain.   Pain improves with rest and heat.  She does not really take any  medication for her back or her leg.  She notes, however, when she takes  Advil for an occasional headache that she does get some improvement in  the lower extremities.   Functional status.  She is able to walk about 60 minutes.  She is able  to climb stairs and she does drive.  She does, however, verbalize that  she has concerns on the steps about falling because of the pain in the  right leg and is concerned it may give way.  She is independent with her  self care.  She lives alone.   She denies problems controlling her bowel or bladder.  She denies  numbness, tremors, tingling.  She denies depression, anxiety, or  suicidal ideation.   REVIEW OF SYSTEMS:  Otherwise negative as well.   PAST MEDICAL HISTORY:  1. Remarkable for COPD.  2. History of rapid heartbeat.  3. History of breast cancer.  4. History of diverticulitis.   PAST SURGICAL HISTORY:  1. Breast cancer status post surgery.  2. Spine surgery 20 years ago,  Dr. Simonne Come.  3. Cholecystectomy.  4. Appendectomy.  5. Sinus surgery.   SOCIAL HISTORY:  The patient lives alone.  She does not smoke nor drink  alcohol.  She does have a dog.   FAMILY HISTORY:  Positive for high blood pressure and alcohol abuse.   MEDICATIONS:  1. Arimidex 1 pill q. day.  2. Megestrol 20 mg 1/2 tablet twice a day.  3. Metoprolol 50 mg 1/2 tablet twice a day.  4. Advair 500/50 twice a day.   Dr. Donnie Coffin prescribes the first two medications and Dr. Kriste Basque prescribes  the last two.   ALLERGIES:  She denies any particular drug allergies.   Incidentally see note that back in November and December she had seen  Dr. Lajoyce Corners who had her scheduled for two epidural injections.   She states after the injections she seems to have had some improvement  in her right leg pain, however, she had an aunt that had some left leg  pain.   She had undergone a lumbar MRI with and without contrast on 11/03/05  which showed multilevel degenerative changes consistent with lumbar  spondylosis, degenerative disk disease.  Facet disease, mild  retrolisthesis of L4 compared to L5 and of L3 compared to L2.  PHYSICAL EXAMINATION:  VITALS:  Today blood pressure is 155/82, pulse  69, respirations 18, 100% saturated on room air.  GENERAL:  She is a well-developed, well-nourished elderly female who  appears a bit younger than her stated age.  She is oriented x3.  Her  speech is clear.  Her affect is bright.  She is alert, cooperative and  pleasant.  She follows commands easily.  EXTREMITIES:  Transitioning from sitting to standing is done without  difficulty.  However, she has a significantly antalgic gait with  decreased weight bearing in the right lower extremity.  She does use a  cane to ambulate outside and occasionally in the inside as well.  Tandem gait was not tested due to the patient's concerns.  Romberg's  tests, however, was performed adequately.  She has significant  limitations  in lumbar forward flexion and essentially very little lumbar  extension and notes increased pain with extension.  SKIN:  She has a  well healed surgical scar over her lumbar spine.  NEURO:  Her coordination is grossly intact.  Seated her reflexes are 2+ at the  patellar and Achilles tendons.  She does have any increased tone.  There  is no clonus noted.  Her motor strength is quite good in hip flexors,  knee extensors, dorsiflexors, plantar flexors and EHL.  Sensory exam is intact with proprioception at the great toe as well as  light touch and pin prick.  No sensory deficits were appreciated.   Straight leg raise is negative.  Internal and external rotation at  bilateral hips did not increase her pain. She has some mild lateral  joint line tenderness in the right knee.  However, seemed to be very  minimal.  No effusions were noted in either knee.  She has good range of  motion at both knees.   She has mild tenderness to the right Achilles tendon compared to the  left.  She has good range of motion at both ankles.   Mild tenderness was noted along the right lateral hip as well as down  the ileal tibial band on the right.   IMPRESSION:  1. Degenerative lumbar spondylosis associated with degenerative disk      disease and degenerative facet disease with multilevel spinal      lateral recess and foraminal stenosis.  2. Mild trochanteric bursitis on the right.  3. Mild right Achilles tenonitis.  4. History of breast cancer.  5. Bilateral leg pain consistent with symptoms of spinal stenosis.   PLAN:  An hour was spent with Ms. Zaragosa in interview, exam and  reviewing her chart and reviewing treatment options with her.  At this  time she would prefer not to pursue further epidural steroid injections.  She will consider medication and will trial her initially on Neurontin  100 mg 1 p.o. q.h.s. x 3 days then 1 p.o. b.i.d. x3 days then 1 p.o.  t.i.d.  I will see her back in the next 4-5  weeks and may consider  titrating her up a bit more on this medication.   We have discussed getting her involved in some rehabilitation which she  seems to be quite interested.  Will consider adding that after she has  been started on the gabapentin.  Will also obtain flexion, extension,  radiographs of her lumbar spine as well in the next few weeks to  evaluate for any significant instability.           ______________________________  Johnathan Hausen.  Pamelia Hoit, M.D.     DMK/MedQ  D:  04/05/2007 12:02:01  T:  04/05/2007 15:00:47  Job #:  [id number]   cc:   Lonzo Cloud. Kriste Basque, MD  [address]  [place]  [place] [address]   Pierce Crane, MD  Fax: [phone]

## 2010-06-28 NOTE — Op Note (Signed)
[name], [name]              ACCOUNT NO.:  [id number]   MEDICAL RECORD NO.:  [id number]          PATIENT TYPE:  AMB   LOCATION:  DSC                          FACILITY:  [facility]   PHYSICIAN:  Currie Paris, M.D.DATE OF BIRTH:  12-28-1933   DATE OF PROCEDURE:  06/26/2005  DATE OF DISCHARGE:                                 OPERATIVE REPORT   CCS [id number]   PREOPERATIVE DIAGNOSIS:  Carcinoma, right breast upper inner quadrant.   POSTOPERATIVE DIAGNOSIS:  Carcinoma, right breast upper inner quadrant.   OPERATION:  Needle guided excision right breast cancer with blue dye  injection and sentinel lymph node biopsy.   SURGEON:  Dr. Jamey Ripa   ANESTHESIA:  General.   CLINICAL HISTORY:  This patient recently had a mammogram found the upper  inner quadrant right breast showed invasive ductal carcinoma on core biopsy.   DESCRIPTION OF PROCEDURE:  The patient seen in the holding area.  She had no  further questions.  Her needle guided films were reviewed showing good  positioning of the wire.  She had no further questions about the surgical  plans.  The right side was marked by the patient and myself as the operative  side.   The patient then taken to operating and room, and after satisfactory general  anesthesia had been obtained, I prepped the areolar area with some alcohol  and the time-out was done.  I then injected 5 mL of dilute methylene blue  dye and massaged this in.  The breast was prepped and draped.  The guidewire  entered medially and tracked laterally in almost directly horizontal  position.  The prior core biopsy site was more laterally located and  apparently had tracked medially for the biopsy.   I made a transverse elliptical incision to include the prior scar from the  core biopsy as well as the guidewire entry site.  I then divided the tissue  superiorly and medially and inferiorly down to the chest wall then came  under all of this, taking the fascia.  As I  got more laterally I was then  able to divide further down both superior and inferior to the chest wall and  then finally with the specimen elevated up into the wound divide the final  lateral attachments.  I was in fatty tissue all around and everything  appeared that we would have good margins.   I infiltrated some Marcaine for postop pain relief using 0.25% plain.  Everything appeared to be dry.  A pack was placed.   Attention was turned the axilla.  I identified a hot area with the NeoProbe  and a transverse incision.  Divided the fatty tissue until I entered the  axillary fat plain.  I saw blue lymphatic fairly anteriorly, tracked this  and it was almost directly posteriorly which was the area was giving [place] some  hot counts.  With some brief dissection I found initially a blue lymph node  and on dissecting that out I found a second adjacent blue lymph node.  These  were both taken out and both had counts of  1500 to 1800.  I initially did  not see any other blue dye nor palpate any additional large nodes, but using  Neoprobe I found another slightly more posteriorly inferior hot area.  A  little dissection there then revealed two very tiny specks of blue which  both proved to be small lymph nodes perhaps 0.1-10 mm.  These both had  counts about 1200 once they were removed.   With that removed, there were no more hot areas in the axilla and again no  other palpable abnormalities noted.  I infiltrated some Marcaine here and  placed a pack.   I returned my attention back to the breast and this had remained dry.  I  then closed the subcu of the breast in case this patient were MammoSite  candidate to give a nice skin to seroma cavity distance.  The skin was  closed with 4-0 Monocryl subcuticular.   Attention was turned back to the axilla and this had remained drained dry  while I was working on the closure of the breast.  It was closed with 3-0  Vicryl and 4-0 Monocryl  subcuticular.   The specimen mammogram indicated that the tumor was in the middle of the  specimen.  Margins were negative by Touch Prep and the four sentinel nodes  were negative by Touch Preps.   Dermabond was applied and sterile dressings.  The patient and procedure  well.  No operative complications.  All counts were correct.      Currie Paris, M.D.  Electronically Signed     CJS/MEDQ  D:  06/26/2005  T:  06/27/2005  Job:  [id number]   cc:   Lonzo Cloud. Kriste Basque, M.D. [facility]  [address]  [place]  [place] [address]

## 2010-06-28 NOTE — Op Note (Signed)
[name], [name]              ACCOUNT NO.:  [id number]   MEDICAL RECORD NO.:  [id number]          PATIENT TYPE:  AMB   LOCATION:  ENDO                         FACILITY:  [facility]   PHYSICIAN:  John C. Madilyn Fireman, M.D.    DATE OF BIRTH:  February 17, 1933   DATE OF PROCEDURE:  12/18/2004  DATE OF DISCHARGE:                                 OPERATIVE REPORT   PROCEDURE:  Colonoscopy.   INDICATIONS FOR PROCEDURE:  History of recent diverticulitis and also due  for colon cancer screening.   PROCEDURE:  The patient was placed in the left lateral decubitus position  and placed on the pulse monitor with continuous low-flow oxygen delivered by  nasal cannula. She was sedated with 25 micrograms IV fentanyl and 2  milligrams IV Versed in addition to medicines given for the previous EGD.  Olympus video pediatric colonoscope inserted into the rectum and advanced to  about 20 to 25 cm where there was resistance encountered and narrowing of  the lumen with no obvious visible stricture or mass. However, I could not  traverse this area with standard scope even though I could see the lumen in  view.  The scope was withdrawn and a standard endoscope was reinserted and  advanced to the same area.  Apparent narrowed or fixed area was traversed  with this scope and there was no visible suspicion of neoplasm, no obvious  fibrosis however. However, this area that appeared to be quite narrowed was  traversed with some difficulty.  There were multiple diverticula in the area  which persistent up to about 30 to 40 cm. Scope was advanced with moderate  difficulty beyond this point to its entire length of 1 meter; however, the  cecum could not be reached with the shorter scope.  It was estimated that  the point of most proximal visualization was somewhere in the mid transverse  colon.  Mucosa in this area appeared normal with no masses or polyps. There  were diverticula seen in the descending and sigmoid colon as noted.   On  withdrawal there was again resistance to pulling the scope through the area  at about 20-25 cm that appeared somewhat fixed and narrowed but no visible  suspicion of neoplasm seen. The scope was then withdrawn and the patient  returned to the recovery room in stable condition. She tolerated the  procedure well. There were no immediate complications.   IMPRESSION:  1.  Suspicion of chronic diverticular stricture near the rectosigmoid      junction precluding passage of the standard pediatric colonoscope beyond      it.  2.  Incomplete procedure to the mid transverse colon with a Olympus standard      endoscope.   PLAN:  Will follow-up this study with barium enema to better assess the  presumed stricture and to image the proximal colon.           ______________________________  Everardo All Madilyn Fireman, M.D.     JCH/MEDQ  D:  12/18/2004  T:  12/18/2004  Job:  [id number]   cc:   Lonzo Cloud. Kriste Basque, M.D.  [facility]  [address]  [place]  [place] [address]

## 2010-06-28 NOTE — Discharge Summary (Signed)
[name], [name]              ACCOUNT NO.:  [id number]   MEDICAL RECORD NO.:  [id number]          PATIENT TYPE:  INP   LOCATION:  0443                         FACILITY:  [facility]   PHYSICIAN:  Lonzo Cloud. Kriste Basque, M.D. [facility] OF BIRTH:  [birth year]   DATE OF ADMISSION:  12/27/2003  DATE OF DISCHARGE:  01/01/2004                                 DISCHARGE SUMMARY   FINAL DIAGNOSES:  1.  Admitted December 27, 2003 with acute diverticulitis and intolerance to      oral outpatient antibiotic regimen. CT scan this admission confirmed      presence of sigmoid diverticulitis with perforation and small abscess,      treated conservatively with IV Zosyn with clinical improvement.      Consultation by gastroenterology (Dr. Arlyce Dice) and general surgery (Dr.      Jamey Ripa) with recommendations for continued outpatient antibiotic      treatment and subsequent sigmoid resection in 6 weeks.  2.  History of asthma, controlled with medications.  3.  History of chronic sinus problems, followed by Dr. Pollyann Kennedy.  4.  History of hypertension, controlled on medications.  5.  History of gastroesophageal reflux disease, controlled on Nexium.  6.  Known history of diverticulosis, previous diverticulitis in the past.  7.  History of previous appendectomy and cholecystectomy.  8.  History of lumbar laminectomy.  9.  History of right inguinal hernia repair.   BRIEF HISTORY AND PHYSICAL:  The patient is a 77 year old white female whom  I have followed for asthma and hypertension. She presented to the office  with a 1 week history of abdominal pain which she stated was all over her  abdomen but worse in the left lower quadrant. This was associated with some  mild nausea but no vomiting. She denied any change in her bowel habits  without diarrhea, constipation, or blood in the stool. She denied any fevers  but had had occasional chills and did note some weakness and fatigue. She  had gone to the [facility] emergency clinic  with this symptoms 8 days prior to  admission and was diagnosed with diverticulitis. She had CBC showing  elevated white count and left lower quadrant tenderness on exam. She was  placed on Augmentin 2,000 mg p.o. b.i.d., since she has a previous reaction  to Cipro and Flagyl. Since starting the Augmentin therapy 8 days prior to  admission, she stated she had not improved significantly. She had persistent  abdominal pain, worsening nausea, and generalized malaise. She had called  our GI department for an appointment with the gastroenterologist but could  not be seen and therefore came to my office for further evaluation, and we  admitted her to the hospital. As noted, she has a history of diverticulitis  in the past. She had been treated on occasion with Cipro and Flagyl, but she  had an allergic reaction. She subsequently had Cipro alone and again had a  reaction. She is not sure if she is allergic to the Flagyl or not (she had  hives from the Cipro).   PAST MEDICAL HISTORY:  1.  As  noted, she has history of asthma controlled on Advair and Accolade.      She has had no recent flares.  2.  She has chronic sinus problems and has been followed by Dr. Pollyann Kennedy for      this.  3.  She has history of hypertension which is controlled on Lopressor.  4.  She has had previous gastroesophageal reflux disease treated with      Nexium.  5.  She has had previous appendectomy and cholecystectomy and a lumbar      laminectomy for low back pain.  6.  She also had a right inguinal hernia repair in the past.   PHYSICAL EXAMINATION:  Physical examination at the time of admission  revealed a 77 year old white female in mild distress. Blood pressure 160/84,  pulse 102 per minute and regular, respirations 20 per minute and not  labored, temperature 98 degrees, O2 sat 98% on room air. She weighed 176  pounds. HEENT exam was negative for except for dry mucous membranes. Neck  exam showed no jugular distention,  no carotid bruits, no thyromegaly or  lymphadenopathy. Respiratory exam was clear to percussion and auscultation.  Cardiac exam revealed a regular rhythm, grade 1/6 systolic ejection murmur  left sternal border. No rubs or gallops detected.  Gastrointestinal exam revealed the abdomen to be soft with intact bowel  sounds, mild tenderness in the left lower quadrant without guarding or  rebound. There was no evidence of organomegaly or masses. GU exam was  deferred. Musculoskeletal exam was negative without significant findings.  There was no clubbing, cyanosis, or edema. Neurological exam was intact  without focal abnormalities detected. Dermatological exam was within normal  limits. No lesions identified.   LABORATORY DATA:  EKG showed normal sinus rhythm and was felt to be within  normal limits. Chest x-ray showed no acute abnormalities and unremarkable  bowel gas pattern on abdominal films. Evidence of previous cholecystectomy  noted and degenerative changes of the lumbar spine and hips. CT scan of the  abdomen and pelvis showed evidence of sigmoid diverticulitis with  perforation. There was a small left adrenal nodule noted as well. She had a  followup CT prior to discharge that showed improving sigmoid diverticulitis  with decreased inflammatory changes.   HOSPITAL COURSE:  The patient was admitted with suspicious for acute  diverticulitis. She has had recurrent diverticulitis in the past and has had  difficulty with Cipro and Flagyl previously. She had been taking Augmentin  for the week prior to this admission but did not appear to be getting  better. She was placed on IV Zosyn, and CT scan was obtained with results  above. She had a GI consultation from Dr. Arlyce Dice who recommended surgical  evaluation, and the patient was seen by Dr. Jamey Ripa. He agreed with need for  elective sigmoid colectomy but hoped that she could improve with IV antibiotics first. The Zosyn appeared to work, and  her symptoms diminished.  Clinically, she improved and had followup CT which showed decreased  diverticulitis. Dr. Charlott Holler felt that she could be safely switched to oral  Augmentin 1,000 mg p.o. b.i.d. with followup in the office and elective  sigmoid colon resection in about 6 weeks. She was felt to be safe for  discharge on August 01, 2003.   MEDICATIONS AT DISCHARGE:  Augmentin 1,000 mg p.o. b.i.d. for 7 to 10 more  days with follow up by Dr. Arlyce Dice for GI and Dr. Jamey Ripa for surgery. She was  instructed  to continue her home medications including Advair 250/50 one puff  b.i.d., Accolade 20 mg p.o. b.i.d., Lopressor 50 mg p.o. q.d., Nexium 40 mg  p.o. q.d.   CONDITION ON DISCHARGE:  Improved.      SMN/MEDQ  D:  01/11/2004  T:  01/12/2004  Job:  [id number]

## 2010-06-28 NOTE — Consult Note (Signed)
NAMEMARLYN, [name]              ACCOUNT NO.:  [id number]   MEDICAL RECORD NO.:  [id number]          PATIENT TYPE:  INP   LOCATION:  0443                         FACILITY:  [facility]   PHYSICIAN:  Currie Paris, M.D.DATE OF BIRTH:  [birth year]   DATE OF CONSULTATION:  12/28/2003  DATE OF DISCHARGE:                                   CONSULTATION   REASON FOR CONSULTATION:  Diverticulitis.   CLINICAL COURSE:  This is a 77 year old lady admitted yesterday by Dr. Kriste Basque  with acute left lower quadrant abdominal pain.  The patient relates that she  has had a prior diagnosis of diverticulitis with a couple of prior episodes  and this is very similar but much worse. She has had several days of  abdominal pain getting much worse into the left lower quadrant.  It had  become much more severe despite being treated with apparently some  antibiotics earlier.  Currently she does feel somewhat uncomfortable but  does not feel like she is in severe pain. She actually feels a little bit  better.   PAST MEDICAL HISTORY:  She stakes some Advair, Nexium, Lopressor, Accolate  on a routine basis. Allergies, MORPHINE gives her nausea and she has had  questionable allergy to SULFA.  She has apparently had some hives with  FLAGYL and CIPRO.  Pas medical history is significant for history of hypertension and asthma.   PAST SURGICAL HISTORY:  Cholecystectomy but she does not remember which  surgeon in [place] did that. She had a hernia repaired and an  appendectomy at the same time as well as some lumbar surgery both of which  were done in other cities remotely.   FAMILY HISTORY:  Unremarkable.   SOCIAL HISTORY:  Does not smoke or drink.   REVIEW OF SYMPTOMS:  Essentially negative, noted in the admission note not  redictated in this noted.   PHYSICAL EXAMINATION:  GENERAL:  The patient is alert and oriented, not at  all uncomfortable.  VITAL SIGNS:  Temperature is 98, blood pressure 120/70,  pulse 75.  HEENT:  Head is normocephalic, eyes nonicteric.  EOM's intact.  NECK:  Supple, no mass or thyromegaly.  LUNGS:  Normal respirations, clear to auscultation.  HEART:  Regular rhythm, no murmurs, rubs or gallops.  ABDOMEN:  Soft but she has marked left lower quadrant suprapubic tenderness.  Minimal rebound is present.  ABDOMEN:  Bowel sounds are present.  RECTAL:  Not done.  EXTREMITIES:  No cyanosis or edema.   LABORATORY DATA:  She does have an elevated white count, her labs are  otherwise unremarkable.  CT scan is noted and reviewed with the radiologist,  Dr. ?She does have what appears to be sigmoid diverticulitis.  In addition,  she had a CT about a year ago that showed some mild stranding consistent  with mild diverticulitis.  In addition, there is a couple of bubbles of air  in the pelvis suggesting she may have had a microperforation.   IMPRESSION:  Acute diverticulitis superimposed on a history of  diverticulosis and prior episodes of diverticulitis.   RECOMMENDATIONS:  At  this point, she is not at all toxic and I think is most  appropriately managed medically as has already been begun by her primary  physicians. Hopefully she will respond to this.  If so, I would recommend  elective sigmoid colectomy which might be done later this admission with a  bowel prep or perhaps wait about 4-6 weeks, bring her back and do an  elective procedure at that point in time.   If alternatively she fails medical therapy and gets worse, then an urgent  sigmoid colectomy will be required.  Most likely this will require a  colostomy.   I have discussed these issues with the patient. She understands the need for  at least initial nonoperative therapy and the possibility that she would  need a colostomy if we had to do this urgently.  I have also explained to  her that I thought given her prior history of multiple episodes that  elective sigmoid colectomy would be appropriate once this  has settled down.     Chri   CJS/MEDQ  D:  12/28/2003  T:  12/29/2003  Job:  [id number]   cc:   Barbette Hair. Arlyce Dice, M.D. [facility]

## 2010-06-28 NOTE — Op Note (Signed)
[name], BEGUE              ACCOUNT NO.:  [id number]   MEDICAL RECORD NO.:  [id number]          PATIENT TYPE:  AMB   LOCATION:  ENDO                         FACILITY:  [facility]   PHYSICIAN:  John C. Madilyn Fireman, M.D.    DATE OF BIRTH:  [birth year]   DATE OF PROCEDURE:  12/18/2004  DATE OF DISCHARGE:                                 OPERATIVE REPORT   INDICATIONS FOR PROCEDURE:  Patient undergoing screening colonoscopy.  He  has had chronic gastroesophageal reflux.  Procedures were done as a one-time  screening for Barrett's esophagus.   PROCEDURE:  The patient was placed in the left lateral decubitus position  and placed on the pulse monitor with continuous low-flow oxygen delivered by  nasal cannula. He was sedated with 75 mcg IV fentanyl and 7.5 milligrams IV  Versed. Olympus video endoscope was advanced under direct vision into the  oropharynx and esophagus .  The esophagus was straight and of normal caliber  with squamocolumnar line at 38 cm.  There was a small hiatal hernia but no  esophagitis, no evidence of Barrett's esophagus.  The stomach was entered. A  small amount of liquid secretions were suctioned from the fundus.  Retroflexed view of cardia was unremarkable. The fundus, body, antrum and  pylorus all appeared normal. The duodenum was entered and both bulb and  second portion were well inspected and appeared to be within normal limits.  The scope was then withdrawn and the patient prepared for colonoscopy. She  tolerated the procedure well. There were no immediate complications.   IMPRESSION:  Small hiatal hernia, otherwise normal study.           ______________________________  Everardo All Madilyn Fireman, M.D.     JCH/MEDQ  D:  12/18/2004  T:  12/18/2004  Job:  [id number]   cc:   Lonzo Cloud. Kriste Basque, M.D. [facility]  [address]  [place]  [place] [address]

## 2010-07-29 ENCOUNTER — Encounter (HOSPITAL_COMMUNITY): Payer: Medicare Other | Attending: Gastroenterology

## 2010-07-29 ENCOUNTER — Other Ambulatory Visit: Payer: Self-pay | Admitting: Gastroenterology

## 2010-07-29 DIAGNOSIS — D649 Anemia, unspecified: Secondary | ICD-10-CM | POA: Insufficient documentation

## 2010-07-30 LAB — CROSSMATCH
Unit division: 0
Unit division: 0

## 2010-08-02 ENCOUNTER — Telehealth: Payer: Self-pay | Admitting: *Deleted

## 2010-08-02 NOTE — Telephone Encounter (Signed)
It has been recommend for Pt to have colonoscopy, and upper endoscopy to determine where Pt is bleed from. Pt has decline to comply with all suggest treatment by cancelling appt. Per nurse Pt is on the verge of being dismissed from practice for non-compliance with treatment plan. Nurse notes that Pt did have a transfusion on last week.

## 2010-08-05 NOTE — Telephone Encounter (Signed)
Please call pt and find out why she refused.

## 2010-08-06 NOTE — Telephone Encounter (Signed)
Spoke with patient and she stated she was not feeling well and that is why she cancelled, I offered and appt and she declined. I asked was there anything we could do and she said not at this time and hung up     KP

## 2010-08-20 ENCOUNTER — Other Ambulatory Visit: Payer: Self-pay | Admitting: Pulmonary Disease

## 2010-09-06 ENCOUNTER — Other Ambulatory Visit: Payer: Self-pay | Admitting: Pulmonary Disease

## 2010-10-02 ENCOUNTER — Other Ambulatory Visit: Payer: Self-pay | Admitting: Family Medicine

## 2010-10-02 ENCOUNTER — Ambulatory Visit (INDEPENDENT_AMBULATORY_CARE_PROVIDER_SITE_OTHER): Payer: Medicare Other | Admitting: Pulmonary Disease

## 2010-10-02 ENCOUNTER — Encounter: Payer: Self-pay | Admitting: Pulmonary Disease

## 2010-10-02 DIAGNOSIS — K449 Diaphragmatic hernia without obstruction or gangrene: Secondary | ICD-10-CM

## 2010-10-02 DIAGNOSIS — J45909 Unspecified asthma, uncomplicated: Secondary | ICD-10-CM

## 2010-10-02 DIAGNOSIS — D518 Other vitamin B12 deficiency anemias: Secondary | ICD-10-CM

## 2010-10-02 DIAGNOSIS — K5730 Diverticulosis of large intestine without perforation or abscess without bleeding: Secondary | ICD-10-CM

## 2010-10-02 DIAGNOSIS — I872 Venous insufficiency (chronic) (peripheral): Secondary | ICD-10-CM

## 2010-10-02 DIAGNOSIS — C50919 Malignant neoplasm of unspecified site of unspecified female breast: Secondary | ICD-10-CM

## 2010-10-02 DIAGNOSIS — I10 Essential (primary) hypertension: Secondary | ICD-10-CM

## 2010-10-02 DIAGNOSIS — K589 Irritable bowel syndrome without diarrhea: Secondary | ICD-10-CM

## 2010-10-02 DIAGNOSIS — M899 Disorder of bone, unspecified: Secondary | ICD-10-CM

## 2010-10-02 DIAGNOSIS — F419 Anxiety disorder, unspecified: Secondary | ICD-10-CM

## 2010-10-02 DIAGNOSIS — D508 Other iron deficiency anemias: Secondary | ICD-10-CM

## 2010-10-02 DIAGNOSIS — F068 Other specified mental disorders due to known physiological condition: Secondary | ICD-10-CM

## 2010-10-02 DIAGNOSIS — M545 Low back pain: Secondary | ICD-10-CM

## 2010-10-02 DIAGNOSIS — E7800 Pure hypercholesterolemia, unspecified: Secondary | ICD-10-CM

## 2010-10-02 MED ORDER — FLUTICASONE-SALMETEROL 500-50 MCG/DOSE IN AEPB: 1.0000 | INHALATION_SPRAY | Freq: Two times a day (BID) | RESPIRATORY_TRACT | Status: DC

## 2010-10-02 MED ORDER — OMEPRAZOLE 20 MG PO CPDR: 20.0000 mg | DELAYED_RELEASE_CAPSULE | Freq: Every day | ORAL | Status: DC

## 2010-10-02 MED ORDER — METOPROLOL TARTRATE 50 MG PO TABS: 50.0000 mg | ORAL_TABLET | Freq: Two times a day (BID) | ORAL | Status: DC

## 2010-10-02 NOTE — Patient Instructions (Addendum)
Today we updated & simplified your med list...  Take the ADVAIR>1 inhalation twice daily... Take the [facility] 600mg > 2 tabs twice daily w/ lots of fluids... Take the METOPROLOL 50mg > one tab twice daily... Take the OMEPRAZOLE 20mg > one daily in the AM (before breakfast)... Take the ARICEPT 10mg > one tab daily... Continue the Calcium tab, Multivitamin tab, & the Vit D capsule daily...  Please return to our lab in the AM for your FASTING blood work and a CXR...    I will call [name] w/ the results when available & discuss any additional work up needed...  Let's plan a follow up appt in one month, sooner if needed for problems...   ADDENDUM:  I called daughter> add FeSO4 325mg /d, Vit C 500mg /d, Vit B12 1074mcg/d.

## 2010-10-02 NOTE — Telephone Encounter (Signed)
This Rx is normally filled by Dr.Nadel, Please advise if this Rx is ok to fill. Thank You   KP

## 2010-10-03 ENCOUNTER — Encounter: Payer: Self-pay | Admitting: Pulmonary Disease

## 2010-10-03 ENCOUNTER — Ambulatory Visit (INDEPENDENT_AMBULATORY_CARE_PROVIDER_SITE_OTHER)
Admission: RE | Admit: 2010-10-03 | Discharge: 2010-10-03 | Disposition: A | Discharge status: Home / Self Care | Payer: Medicare Other | Source: Ambulatory Visit | Attending: Pulmonary Disease | Admitting: Pulmonary Disease

## 2010-10-03 ENCOUNTER — Other Ambulatory Visit (INDEPENDENT_AMBULATORY_CARE_PROVIDER_SITE_OTHER): Payer: Medicare Other

## 2010-10-03 DIAGNOSIS — F419 Anxiety disorder, unspecified: Secondary | ICD-10-CM

## 2010-10-03 DIAGNOSIS — F411 Generalized anxiety disorder: Secondary | ICD-10-CM

## 2010-10-03 DIAGNOSIS — E7800 Pure hypercholesterolemia, unspecified: Secondary | ICD-10-CM

## 2010-10-03 DIAGNOSIS — Z79899 Other long term (current) drug therapy: Secondary | ICD-10-CM

## 2010-10-03 DIAGNOSIS — J45909 Unspecified asthma, uncomplicated: Secondary | ICD-10-CM

## 2010-10-03 DIAGNOSIS — D508 Other iron deficiency anemias: Secondary | ICD-10-CM

## 2010-10-03 DIAGNOSIS — D518 Other vitamin B12 deficiency anemias: Secondary | ICD-10-CM

## 2010-10-03 DIAGNOSIS — I10 Essential (primary) hypertension: Secondary | ICD-10-CM

## 2010-10-03 LAB — CBC WITH DIFFERENTIAL/PLATELET
Basophils Absolute: 0.1 10*3/uL (ref 0.0–0.1)
Basophils Relative: 1.2 % (ref 0.0–3.0)
Eosinophils Absolute: 0.2 10*3/uL (ref 0.0–0.7)
Eosinophils Relative: 2 % (ref 0.0–5.0)
HCT: 33.1 % — ABNORMAL LOW (ref 36.0–46.0)
Hemoglobin: 10.5 g/dL — ABNORMAL LOW (ref 12.0–15.0)
Lymphocytes Relative: 25.3 % (ref 12.0–46.0)
Lymphs Abs: 2 10*3/uL (ref 0.7–4.0)
MCHC: 31.7 g/dL (ref 30.0–36.0)
MCV: 73.1 fl — ABNORMAL LOW (ref 78.0–100.0)
Monocytes Absolute: 0.6 10*3/uL (ref 0.1–1.0)
Monocytes Relative: 7.2 % (ref 3.0–12.0)
Neutro Abs: 5.1 10*3/uL (ref 1.4–7.7)
Neutrophils Relative %: 64.3 % (ref 43.0–77.0)
Platelets: 465 10*3/uL — ABNORMAL HIGH (ref 150.0–400.0)
RBC: 4.53 Mil/uL (ref 3.87–5.11)
RDW: 28.2 % — ABNORMAL HIGH (ref 11.5–14.6)
WBC: 7.9 10*3/uL (ref 4.5–10.5)

## 2010-10-03 LAB — LIPID PANEL
HDL: 54.9 mg/dL (ref >39.00)
LDL Cholesterol: 111 mg/dL — ABNORMAL HIGH (ref 0–99)
Total CHOL/HDL Ratio: 3
VLDL: 14.2 mg/dL (ref 0.0–40.0)

## 2010-10-03 LAB — HEPATIC FUNCTION PANEL
Albumin: 3.6 g/dL (ref 3.5–5.2)
Alkaline Phosphatase: 88 U/L (ref 39–117)
Bilirubin, Direct: 0.1 mg/dL (ref 0.0–0.3)

## 2010-10-03 LAB — VITAMIN B12: Vitamin B-12: 242 pg/mL (ref 211–911)

## 2010-10-03 LAB — HEMOGLOBIN A1C: Hgb A1c MFr Bld: 5.9 % (ref 4.6–6.5)

## 2010-10-03 LAB — BASIC METABOLIC PANEL
Calcium: 9.3 mg/dL (ref 8.4–10.5)
GFR: 94.38 mL/min (ref >60.00)
Sodium: 137 mEq/L (ref 135–145)

## 2010-10-03 NOTE — Progress Notes (Signed)
Subjective:    Patient ID: [name], female    DOB: August 11, 1933, 77 y.o.   MRN: [id number]  HPI 77 y/o WF here to re-establish care after a >75yr hiatus... she has multiple medical problems as noted below & she also sees DrJHayes for GI, DrRKaplan for GYN, and DrRubin for Breast Cancer.  ~  July 19, 2008:  I last saw [name] 5/08 for an AB exac... on [id number]... hx chronic sinus problems w/ head congestion/ drainage and prev Rx w/ OTC antihistamines, Saline, Nasonex, & Astelin (but she doesn't remember any of these meds)... presents today for general check up, review of mult medical problems, and to discuss "mixture in my head"- meaning head congestion, drainage, sneezing, cough, & head aches&pains ("It's just my fool head")... Allergy/ Sinus eval from DrESL in the past, and prev ENT eval from [facility] (1997) & DrJRosen (2005)> she has refused CT Sinus for further eval...  ~  October 02, 2010:  >37yr ROV (again) & brought in by her daughter [name] (work# [phone], cell# [phone]); there is much interval history gleaned from daughter, Designer, multimedia EMR records & [employer] records & TC w/ DrJHayes >>    9/11:  Family took her to ER w/ weakness, nausea, FTT at home> eval revealed severe microcytic anemia w/ Hg=5.3, MCV=60, Fe/Tibc weren't done; she was transfused & GI consulted (DrGanem, DrJHayes); stool hemoccult was NEG, PT/INR & LFTs were normal; pt refused EGD/ Colon & was obviously demented & difficult to deal w/ in the Hosp> Psychiatry was called & she was deemed incompetent, [name] declared her Guardian by the Court;  She was stabilized & disch home on Metoprolol, Protonix, Femara, Xanax, B12 tabs...    10/11:  She was seen post hosp by DrLowne just one time> Hg=11.6, MCV=74, Fe=30, TIBC=309, B12=765, VitD=23; she was given a Vit B12 shot & a PNEUMOVAX injection and asked to ret for monthly B12 shots but she never returned...    2/12:  She went to see DrJHayes w/ Hg reported 8.4 & she  was relatively asymptomatic; started on Iron, stools negx6; he recommended repeat EGD/ Colon, had it sched several times but she repeatedly cancelled/ refused/ etc; her Hg dropped to 7.7, then 6.3, had 2/6 repeat stool cards pos for occult blood, & she agreed to transfusion- done as outpt 6/12...    8/12:  Now daughter brings her back in to see me & I spent several hours piecing together her story >>    Asthma> this has NOT been an issue but oddly enough the Advair is one of the few meds she will take regularly w/o being prompted...    HBP> controlled well on the Metoprolol 50mg Bid    HH seen on CXR> may be source of GI blood loss & her iron defic anemia; rec OMEPRAZOLE 20mg /d for now...    Osteopenia & ?compression fx ~L1 on CXR> we will look into this later, for now rec continuing Calcium, MVI, Vit D 1000/d...    Anemia> ?etiology (never determined w/ certainty) but most likely her mod HH seen on CXR (prev EGD/ Colon 2006 showed smHH, divertics w/ stricture, no lesions seen); Hg now 10.5, MCV=73, Fe not done; we will continue RX w/ FESO4 325mg /d & take w/ Vit C 500mg /d; plan careful f/u Hg & could consider Fe infusion if nec...    Vit B12 defic> it was 188 in hosp 9/11 & started on B12 shots immed by the hospitalists; f/u level 765 shortly after disch  but she never ret for continued shots; B12 level now 242 & we discussed trial of oral supplementation taking po daily...    Dementia> she is seeing DrPlovsky for Psyche> on Aricept 10mg /d...   Current Problems:   << ALLERGIC reaction to CIPRO in the past w/ HIVES >>  ALLERGIC RHINITIS (ICD-477.9) - prev allergy eval yrs ago by DrESL & on shots for awhile. CHRONIC RHINITIS (ICD-472.0) - prev ENT eval 2005 by DrRosen for recurrent sinus infections, pressure in her head & HA's... he wanted to do Sinus CT but she declined... Rx w/ antihist/ saline/ Nasonex...  ASTHMA (ICD-493.90) - controlled on ADVAIR500 Bid; she no longer has a rescue inhaler;  denies breathing difficulty. Cough, sputum, hemoptysis, etc... ~  CXR 5/07 (pre-op for breast surg) showed mild chronic changes, scarring left base, NAD.Marland Kitchen. ~  CXR 8/12 showed mod HH seen behind the heart, mild scarring vs atx left base, wedging of lower TSpine vert body...  HYPERTENSION (ICD-401.9) - controlled on LOPRESSOR 50mg  Bid... she was seen by [facility] in the 1990's... ~  2DEcho 4/91 showed mild MVP w/ myxomatous ant leaflet & mild MR... ~  Myocardial Perfusion Scan 5/95 was WNL showing normal contractility & uptake in all myocardial segments.  SUPRAVENTRICULAR TACHYCARDIA >> she developed a transient SVT 9/11 during her Hosp for anemia; subseq EKG showed NSR w/ PACs & minor NSSTTWA.  VENOUS INSUFFICIENCY (ICD-459.81) - she knows to avoid sodium, elevate legs, wear support hose when able...  HIATAL HERNIA (ICD-553.3) - she uses PRILOSEC 20mg  Prn...  ~  EGD 11/06 by DrJHayes showed sm HH, otherw normal, no evid of Barrett's... ~  CXR 8/12 showed ?mod sized HH behind the heart; ?source of her iron defic anemia? (only 2/18 stool cards pos for occult blood over the past yr).  IRRITABLE BOWEL SYNDROME (ICD-564.1) DIVERTICULOSIS OF COLON (ICD-562.10) -  ~  11/05: she was [facility] w/ diverticulitis & abscess> Rx'd w/ antibiotics & improved; DrStreck considered sigmoid colectomy but she improved w/ high fiber diet etc... ~  Colonoscopy 11/06 by [facility] showed divertic stricture in the rectosigmoid junction, scope passed only to the mid transverse colon...  ~  11/06: subseq BE showed innumerable divertics, some narrowing in the sigmoid region, otherw neg...  Hx of BREAST CANCER (ICD-174.9) - off ARIMEDEX per DrRubin... she had an abn mammogram in 2007 w/ subseq right lumpectomy & sentinel node bx by DrStreck pos for invasive ductal cancer- stage I... post op XRT from DrWu, and followed by DrRubin ever since then...   DEGENERATIVE ARTHRITIS >> ~  XRay of right hip 9/07 showed marked degenerative  changes w/ narrowing, sclerosis, spur formation...  LOW BACK PAIN SYNDROME (ICD-724.2) - she had lumbar surgery in 1989 by DrAplington w/ recurrent back and leg pain in 2006 & was evaluated by DrAplington & DrNudelman- he rec conservative management as the required surgery would be quite extensive... she saw DrKirshmayer in the pain management cliic as well... ~  MRI Lumbar Spine 9/07 showed degenerative lumbar spondylosis w/ assoc DDD & degen facet arthritis; multilevel spinal, lat recess, & foraminal stenoses at variopus levels...  OSTEOPENIA (ICD-733.90) -  DrRubin had her on ALENDRONATE weekly + caltrate & MVI> but this was stopped ?when ?why... ~  BMD at [facility] by DrRubin 7/07 showed osteopenia w/ TScores -1.5 in Spine, & -1.3 in right hip...  DEMENTIA >> ~  CT Brain 9/11 by the hospitalists showed generalized atrophy, chronic small vessel disease, NAD (no mets), chronic sinusitis... ~  CT  Brain 1/12 by DrPlovsky showed some atrophy, small vessel ischemic disease, no acute infarcts; there was also evid of extensive chronic sinusitis  ANEMIA >> Fe-deficient anemia... ~  [facility] 9/11 w/ severe microcytic anemia ?etiology; Hg=5.3, MCV=60; Fe/Tibc weren't done; transfused ?units of ABpos blood & final Hg=11.4, MCV=71... ~  In the [facility] 9/11 her B12 level was 188 & she was given B12 shots w/o further eval; B12 level improved to 765 w/ the shots... ~  She saw DrLowne in the office 10/11 & was sched for B12 shots to start but she never returned... ~  Starting in 2/12 she was re-eval by [facility] w/ Hg=8.4, she no showed/ cancelled/ refused several attempts to sched EGD/ Colon & Hg dropped t 7.7 then 6.3; she was transfused as outpt then lost to f/u until her 8/12 OV w/ here... ~  Labs 8/12 showed Hg=10.5, MCV=73, Fe=not done> we decided on FeSO4 325mg /d w/ Vit C 500mg /d  VITAMIN B12 Deficiency >> ~  Labs in hosp 9/11 showed Vit B12 level = 188; she was immed started on shots by the hospitalists; f/u B12  level 10/11 was 765 & DrLowne planned monthly shots but she never showed up... ~  Labs here 8/12 showed B12 level = 242 and we decided to supplement orally daily for now...    Past Surgical History  Procedure Date  . Appendectomy 1950;s  . Right inguinal hernia repair   . Lumbar laminectomy 1989    Dr, Simonne Come  . Laparoscopic cholecystectomy 1992    Dr. Orson Slick  . Right breast lumpectomy/sentinel node bx 06/2005    Dr. Jamey Ripa    Outpatient Encounter Prescriptions as of 10/02/2010  Medication Sig Dispense Refill  . Calcium Carbonate-Vitamin D (CALTRATE 600+D) 600-400 MG-UNIT per tablet Take 1 tablet by mouth 2 (two) times daily.        . cholecalciferol (VITAMIN D) 1000 UNITS tablet Take 1,000 Units by mouth daily.        Marland Kitchen donepezil (ARICEPT) 10 MG tablet Take 10 mg by mouth at bedtime as needed.        . Fluticasone-Salmeterol (ADVAIR DISKUS) 500-50 MCG/DOSE AEPB Inhale 1 puff into the lungs every 12 (twelve) hours.  60 each  11  . guaiFENesin (MUCINEX) 600 MG 12 hr tablet Take 1,200 mg by mouth 2 (two) times daily. With plenty of fluids       . metoprolol (LOPRESSOR) 50 MG tablet Take 1 tablet (50 mg total) by mouth 2 (two) times daily.  60 tablet  11  . omeprazole (PRILOSEC) 20 MG capsule Take 1 capsule (20 mg total) by mouth daily.  30 capsule  11    Allergies  Allergen Reactions  . Levofloxacin     REACTION: dizziness    Current Medications, Allergies, Past Medical History, Past Surgical History, Family History, and Social History were reviewed in [employer] record.   Review of Systems       See HPI - all other systems neg except as noted...       The patient complains of some weakness, & dyspnea on exertion.  The patient denies anorexia, fever, weight loss, weight gain, vision loss, decreased hearing, hoarseness, chest pain, syncope, peripheral edema, prolonged cough, headaches, hemoptysis, abdominal pain, melena, hematochezia, severe  indigestion/heartburn, hematuria, incontinence, muscle weakness, suspicious skin lesions, transient blindness, difficulty walking, depression, unusual weight change, abnormal bleeding, enlarged lymph nodes, and angioedema.   Objective:   Physical Exam    WD, sl thin &  pale, 77 y/o WF in NAD... she is chronically ill appearing. GENERAL:  Alert & mostly cooperative, obvious dementia/ some confusion. HEENT:  [place]/AT, EOM-wnl, PERRLA, EACs-clear, TMs-wnl, NOSE- sl congested, THROAT-clear & wnl. NECK:  Supple w/ fairROM; no JVD; normal carotid impulses w/o bruits; no thyromegaly or nodules palpated; no lymphadenopathy. CHEST:  Clear to P & A; without wheezes or rales- few rhonchi scattered bilat... HEART:  Regular Rhythm; without murmurs/ rubs/ or gallops heard... ABDOMEN:  Soft & nontender; normal bowel sounds; no organomegaly or masses detected. EXT: without deformities, mild arthritic changes; no varicose veins/ +venous insuffic/ no edema. NEURO:  CN's intact; motor testing normal; sensory testing normal; gait normal & balance OK. DERM:  No lesions noted; no rash etc...  MMSE 10/02/10 >> Date 2012 Aug ?day; she didn't know the Pres/ VP/ Gov/ or prev administration; remembered 3/3 objects immed but 0/3 after distraction; serial 7's= [id number]?; WORLD backwards= DLROW; proverbs were mixed- one abstracted (glass houses), others concrete (stitch, etc)...   Assessment & Plan:   Asthma> this has NOT been an issue but oddly enough the Advair is one of the few meds she will take regularly w/o being prompted...  HBP> BP controlled well on the Metoprolol 50mg Bid  HH seen on CXR> may be source of GI blood loss & her iron defic anemia; rec OMEPRAZOLE 20mg /d for now...  IBS, Divertics w/ stricture in sigmoid>  See 2005 hosp for diverticulitis, then subseq work up by [employer] in 2006...  BREAST CANCER>  Followed by Dennison Mascot, she has been off her Arimidex & I don't have any recent notes from him to  review...  Osteopenia & ?compression fx ~L1 on CXR> we will look into this later; prev on alendronate from DrRubin, for now rec continuing Calcium, MVI, Vit D 1000/d...  Anemia> ?etiology (never determined w/ certainty) but most likely her mod HH seen on CXR (prev EGD/ Colon 2006 showed smHH, divertics w/ stricture, no lesions seen); Hg now 10.5, MCV=73, Fe not done; we will continue RX w/ FESO4 325mg /d & take w/ Vit C 500mg /d; plan careful f/u Hg & could consider Fe infusion if nec...  Vit B12 defic> it was 188 in hosp 9/11 & started on B12 shots immed by the hospitalists; f/u level 765 shortly after disch but she never ret for continued shots; B12 level now 242 & we discussed trial of oral supplementation taking po daily...  Dementia> she is seeing DrPlovsky for Psyche> on Aricept 10mg /d.Marland KitchenMarland Kitchen

## 2010-10-07 LAB — PROTEIN ELECTROPHORESIS, SERUM, WITH REFLEX
Albumin ELP: 49.8 % — ABNORMAL LOW (ref 55.8–66.1)
Alpha-1-Globulin: 5.7 % — ABNORMAL HIGH (ref 2.9–4.9)
Alpha-2-Globulin: 13.8 % — ABNORMAL HIGH (ref 7.1–11.8)
Beta Globulin: 8.2 % — ABNORMAL HIGH (ref 4.7–7.2)
Total Protein, Serum Electrophoresis: 7.4 g/dL (ref 6.0–8.3)

## 2010-10-08 LAB — IGG, IGA, IGM: IgG (Immunoglobin G), Serum: 1170 mg/dL (ref 690–1700)

## 2010-11-04 ENCOUNTER — Other Ambulatory Visit: Payer: Self-pay | Admitting: *Deleted

## 2010-11-04 MED ORDER — METOPROLOL TARTRATE 50 MG PO TABS: 50.0000 mg | ORAL_TABLET | Freq: Two times a day (BID) | ORAL | Status: DC

## 2010-11-05 ENCOUNTER — Ambulatory Visit (INDEPENDENT_AMBULATORY_CARE_PROVIDER_SITE_OTHER): Payer: Medicare Other | Admitting: Pulmonary Disease

## 2010-11-05 ENCOUNTER — Encounter: Payer: Self-pay | Admitting: Pulmonary Disease

## 2010-11-05 DIAGNOSIS — D518 Other vitamin B12 deficiency anemias: Secondary | ICD-10-CM

## 2010-11-05 DIAGNOSIS — K5730 Diverticulosis of large intestine without perforation or abscess without bleeding: Secondary | ICD-10-CM

## 2010-11-05 DIAGNOSIS — J45909 Unspecified asthma, uncomplicated: Secondary | ICD-10-CM

## 2010-11-05 DIAGNOSIS — K449 Diaphragmatic hernia without obstruction or gangrene: Secondary | ICD-10-CM

## 2010-11-05 DIAGNOSIS — M545 Low back pain: Secondary | ICD-10-CM

## 2010-11-05 DIAGNOSIS — I10 Essential (primary) hypertension: Secondary | ICD-10-CM

## 2010-11-05 DIAGNOSIS — K589 Irritable bowel syndrome without diarrhea: Secondary | ICD-10-CM

## 2010-11-05 DIAGNOSIS — C50919 Malignant neoplasm of unspecified site of unspecified female breast: Secondary | ICD-10-CM

## 2010-11-05 DIAGNOSIS — F068 Other specified mental disorders due to known physiological condition: Secondary | ICD-10-CM

## 2010-11-05 DIAGNOSIS — M899 Disorder of bone, unspecified: Secondary | ICD-10-CM

## 2010-11-05 DIAGNOSIS — D508 Other iron deficiency anemias: Secondary | ICD-10-CM

## 2010-11-05 DIAGNOSIS — I872 Venous insufficiency (chronic) (peripheral): Secondary | ICD-10-CM

## 2010-11-05 DIAGNOSIS — J309 Allergic rhinitis, unspecified: Secondary | ICD-10-CM

## 2010-12-01 ENCOUNTER — Encounter: Payer: Self-pay | Admitting: Pulmonary Disease

## 2010-12-01 NOTE — Patient Instructions (Signed)
Today we updated your med list in EPIC...    Continue your same meds for now...  Call for any problems...  Let's plan a follow up visit in 2months w/ lab work.Marland Kitchen

## 2010-12-01 NOTE — Progress Notes (Signed)
Subjective:    Patient ID: [name], female    DOB: 09-Dec-1933, 77 y.o.   MRN: [id number]  HPI 77 y/o WF here to re-establish care after a >27yr hiatus... she has multiple medical problems as noted below & she also sees DrJHayes for GI, DrRKaplan for GYN, and DrRubin for Breast Cancer.  ~  July 19, 2008:  I last saw [name] 5/08 for an AB exac... on Advair500-1puffBid... hx chronic sinus problems w/ head congestion/ drainage and prev Rx w/ OTC antihistamines, Saline, Nasonex, & Astelin (but she doesn't remember any of these meds)... presents today for general check up, review of mult medical problems, and to discuss "mixture in my head"- meaning head congestion, drainage, sneezing, cough, & head aches&pains ("It's just my fool head")... Allergy/ Sinus eval from DrESL in the past, and prev ENT eval from [facility] (1997) & DrJRosen (2005)> she has refused CT Sinus for further eval...  ~  October 02, 2010:  >45yr ROV & brought in by her daughter [name] (work# [phone], cell# [phone]); there is much interval history gleaned from daughter, [employer] EMR records, [employer] records & TC w/ DrJHayes>    9/11:  Family took her to ER w/ weakness, nausea, FTT at home> eval revealed severe microcytic anemia w/ Hg=5.3, MCV=60, Fe/Tibc weren't done; she was transfused & GI consulted (DrGanem, DrJHayes); stool hemoccult was NEG, PT/INR & LFTs were normal; pt refused EGD/ Colon & was obviously demented & difficult to deal w/ in the Hosp> Psychiatry was called & she was deemed incompetent, [name] declared her Guardian by the Court;  She was stabilized & disch home on Metoprolol, Protonix, Femara, Xanax, B12 tabs...    10/11:  She was seen post hosp by DrLowne just one time> Hg=11.6, MCV=74, Fe=30, TIBC=309, B12=765, VitD=23; she was given a Vit B12 shot & a PNEUMOVAX injection and asked to ret for monthly B12 shots but she never returned...    2/12:  She went to see DrJHayes w/ Hg reported 8.4 & she was  relatively asymptomatic; started on Iron, stools negx6; he recommended repeat EGD/ Colon, had it sched several times but she repeatedly cancelled/ refused/ etc; her Hg dropped to 7.7, then 6.3, had 2of6 repeat stool cards pos for occult blood, & she agreed to transfusion- done as outpt 6/12...    8/12:  Now daughter brings her back in to see me & I spent several hours piecing together her story >>    Asthma> this has NOT been an issue but oddly enough the Advair is one of the few meds she will take regularly w/o being prompted...    HBP> controlled well on the Metoprolol 50mg Bid    HH seen on CXR> may be source of GI blood loss & her iron defic anemia; rec OMEPRAZOLE 20mg /d for now...    Osteopenia & ?compression fx ~L1 on CXR> we will look into this later, for now rec continuing Calcium, MVI, Vit D 1000/d...    Anemia> ?etiology (never determined w/ certainty) but most likely her mod HH seen on CXR (prev EGD/ Colon 2006 showed smHH, divertics w/ stricture, no lesions seen); Hg now 10.5, MCV=73, Fe not done; we will continue RX w/ FESO4 325mg /d & take w/ Vit C 500mg /d; plan careful f/u Hg & could consider Fe infusion if nec...    Vit B12 defic> it was 188 in hosp 9/11 & started on B12 shots immed by the hospitalists; f/u level 765 shortly after disch but she never  ret for continued shots; B12 level now 242 & we discussed trial of oral supplementation taking po daily...    Dementia> she is seeing DrPlovsky for Psyche> on Aricept 10mg /d...  ~  November 04, 2010:  71mo ROV & she is feeling sl better just c/o some allergy symptoms; she is getting some exercise walking "[name]" her Cocker spaniel up the hill & back she says...  Breathing is stable on her Advair & Mucinex;  BP is acceptable on Metop50Bid;  She denies GI symptoms on her Omep & stools dark from the Fe;  She continues to see DrPlovsky on the Aricept;  She notes fair appetite & wt is down 10# to 133# today & asked to start dietary  supplements...  We decided to continue same meds and recheck in 89mo w/ f/u labs...          Current Problems:   << ALLERGIC reaction to CIPRO in the past w/ HIVES >>  ALLERGIC RHINITIS (ICD-477.9) - prev allergy eval yrs ago by DrESL & on shots for awhile. CHRONIC RHINITIS (ICD-472.0) - prev ENT eval 2005 by DrRosen for recurrent sinus infections, pressure in her head & HA's... he wanted to do Sinus CT but she declined... Rx w/ antihist/ saline/ Nasonex...  ASTHMA (ICD-493.90) - controlled on ADVAIR500 Bid; she no longer has a rescue inhaler; denies breathing difficulty. Cough, sputum, hemoptysis, etc... ~  CXR 5/07 (pre-op for breast surg) showed mild chronic changes, scarring left base, NAD.Marland Kitchen. ~  CXR 8/12 showed mod HH seen behind the heart, mild scarring vs atx left base, wedging of lower TSpine vert body...  HYPERTENSION (ICD-401.9) - controlled on LOPRESSOR 50mg  Bid... she was seen by [facility] in the 1990's... ~  2DEcho 4/91 showed mild MVP w/ myxomatous ant leaflet & mild MR... ~  Myocardial Perfusion Scan 5/95 was WNL showing normal contractility & uptake in all myocardial segments.  SUPRAVENTRICULAR TACHYCARDIA >> she developed a transient SVT 9/11 during her Hosp for anemia; subseq EKG showed NSR w/ PACs & minor NSSTTWA.  VENOUS INSUFFICIENCY (ICD-459.81) - she knows to avoid sodium, elevate legs, wear support hose when able...  HIATAL HERNIA (ICD-553.3) - she uses PRILOSEC 20mg  Prn...  ~  EGD 11/06 by DrJHayes showed sm HH, otherw normal, no evid of Barrett's... ~  CXR 8/12 showed ?mod sized HH behind the heart; ?source of her iron defic anemia? (only 2/18 stool cards pos for occult blood over the past yr).  IRRITABLE BOWEL SYNDROME (ICD-564.1) DIVERTICULOSIS OF COLON (ICD-562.10) -  ~  11/05: she was [facility] w/ diverticulitis & abscess> Rx'd w/ antibiotics & improved; DrStreck considered sigmoid colectomy but she improved w/ high fiber diet etc... ~  Colonoscopy 11/06 by [facility]  showed divertic stricture in the rectosigmoid junction, scope passed only to the mid transverse colon...  ~  11/06: subseq BE showed innumerable divertics, some narrowing in the sigmoid region, otherw neg...  Hx of BREAST CANCER (ICD-174.9) - off ARIMEDEX per DrRubin... she had an abn mammogram in 2007 w/ subseq right lumpectomy & sentinel node bx by DrStreck pos for invasive ductal cancer- stage I... post op XRT from DrWu, and followed by DrRubin ever since then...   DEGENERATIVE ARTHRITIS >> ~  XRay of right hip 9/07 showed marked degenerative changes w/ narrowing, sclerosis, spur formation...  LOW BACK PAIN SYNDROME (ICD-724.2) - she had lumbar surgery in 1989 by DrAplington w/ recurrent back and leg pain in 2006 & was evaluated by DrAplington & DrNudelman- he rec conservative management  as the required surgery would be quite extensive... she saw DrKirshmayer in the pain management cliic as well... ~  MRI Lumbar Spine 9/07 showed degenerative lumbar spondylosis w/ assoc DDD & degen facet arthritis; multilevel spinal, lat recess, & foraminal stenoses at variopus levels...  OSTEOPENIA (ICD-733.90) -  DrRubin had her on ALENDRONATE weekly + caltrate & MVI> but this was stopped ?when ?why... ~  BMD at [facility] by DrRubin 7/07 showed osteopenia w/ TScores -1.5 in Spine, & -1.3 in right hip...  DEMENTIA >> ~  CT Brain 9/11 by the hospitalists showed generalized atrophy, chronic small vessel disease, NAD (no mets), chronic sinusitis... ~  CT Brain 1/12 by DrPlovsky showed some atrophy, small vessel ischemic disease, no acute infarcts; there was also evid of extensive chronic sinusitis  ANEMIA >> Fe-deficient anemia... ~  [facility] 9/11 w/ severe microcytic anemia ?etiology; Hg=5.3, MCV=60; Fe/Tibc weren't done; transfused ?units of ABpos blood & final Hg=11.4, MCV=71... ~  In the [facility] 9/11 her B12 level was 188 & she was given B12 shots w/o further eval; B12 level improved to 765 w/ the shots... ~  She saw  DrLowne in the office 10/11 & was sched for B12 shots to start but she never returned... ~  Starting in 2/12 she was re-eval by [facility] w/ Hg=8.4, she no showed/ cancelled/ refused several attempts to sched EGD/ Colon & Hg dropped t 7.7 then 6.3; she was transfused as outpt then lost to f/u until her 8/12 OV w/ here... ~  Labs 8/12 showed Hg=10.5, MCV=73, Fe=not done> we decided on FeSO4 325mg /d w/ Vit C 500mg /d  VITAMIN B12 Deficiency >> ~  Labs in hosp 9/11 showed Vit B12 level = 188; she was immed started on shots by the hospitalists; f/u B12 level 10/11 was 765 & DrLowne planned monthly shots but she never showed up... ~  Labs here 8/12 showed B12 level = 242 and we decided to supplement orally daily for now...   Past Surgical History  Procedure Date  . Appendectomy 1950;s  . Right inguinal hernia repair   . Lumbar laminectomy 1989    Dr, Simonne Come  . Laparoscopic cholecystectomy 1992    Dr. Orson Slick  . Right breast lumpectomy/sentinel node bx 06/2005    Dr. Jamey Ripa    Outpatient Encounter Prescriptions as of 11/05/2010  Medication Sig Dispense Refill  . Calcium Carbonate-Vitamin D (CALTRATE 600+D) 600-400 MG-UNIT per tablet Take 1 tablet by mouth 2 (two) times daily.        . cholecalciferol (VITAMIN D) 1000 UNITS tablet Take 1,000 Units by mouth daily.        Marland Kitchen donepezil (ARICEPT) 10 MG tablet Take 10 mg by mouth at bedtime as needed.        . Fluticasone-Salmeterol (ADVAIR DISKUS) 500-50 MCG/DOSE AEPB Inhale 1 puff into the lungs every 12 (twelve) hours.  60 each  11  . guaiFENesin (MUCINEX) 600 MG 12 hr tablet Take 1,200 mg by mouth 2 (two) times daily. With plenty of fluids       . metoprolol (LOPRESSOR) 50 MG tablet Take 1 tablet (50 mg total) by mouth 2 (two) times daily.  60 tablet  11  . Multiple Vitamins-Minerals (WOMENS MULTIVITAMIN PLUS) TABS Take 1 tablet by mouth daily.        Marland Kitchen omeprazole (PRILOSEC) 20 MG capsule Take 1 capsule (20 mg total) by mouth daily.  30  capsule  11  . megestrol (MEGACE) 40 MG tablet Take 40 mg by mouth daily.  Allergies  Allergen Reactions  . Levofloxacin     REACTION: dizziness    Current Medications, Allergies, Past Medical History, Past Surgical History, Family History, and Social History were reviewed in [employer] record.   Review of Systems       See HPI - all other systems neg except as noted...       The patient complains of some weakness, & dyspnea on exertion.  The patient denies anorexia, fever, weight loss, weight gain, vision loss, decreased hearing, hoarseness, chest pain, syncope, peripheral edema, prolonged cough, headaches, hemoptysis, abdominal pain, melena, hematochezia, severe indigestion/heartburn, hematuria, incontinence, muscle weakness, suspicious skin lesions, transient blindness, difficulty walking, depression, unusual weight change, abnormal bleeding, enlarged lymph nodes, and angioedema.   Objective:   Physical Exam    WD, sl thin & pale, 77 y/o WF in NAD... she is chronically ill appearing. GENERAL:  Alert & mostly cooperative, obvious dementia/ some confusion. HEENT:  [place]/AT, EOM-wnl, PERRLA, EACs-clear, TMs-wnl, NOSE- sl congested, THROAT-clear & wnl. NECK:  Supple w/ fairROM; no JVD; normal carotid impulses w/o bruits; no thyromegaly or nodules palpated; no lymphadenopathy. CHEST:  Clear to P & A; without wheezes or rales- few rhonchi scattered bilat... HEART:  Regular Rhythm; without murmurs/ rubs/ or gallops heard... ABDOMEN:  Soft & nontender; normal bowel sounds; no organomegaly or masses detected. EXT: without deformities, mild arthritic changes; no varicose veins/ +venous insuffic/ no edema. NEURO:  CN's intact; motor testing normal; sensory testing normal; gait normal & balance OK. DERM:  No lesions noted; no rash etc...  MMSE 10/02/10 >> Date 2012 Aug ?day; she didn't know the Pres/ VP/ Gov/ or prev administration; remembered 3/3 objects immed but  0/3 after distraction; serial 7's= 100-93-86-7?; WORLD backwards= DLROW; proverbs were mixed- one abstracted (glass houses), others concrete (stitch, etc)...   Assessment & Plan:   Asthma> this has NOT been an issue but oddly enough the Advair is one of the few meds she will take regularly w/o being prompted...  HBP> BP controlled well on the Metoprolol 50mg Bid  HH seen on CXR> may be source of GI blood loss & her iron defic anemia; rec OMEPRAZOLE 20mg /d for now...  IBS, Divertics w/ stricture in sigmoid>  See 2005 hosp for diverticulitis, then subseq work up by [employer] in 2006...  BREAST CANCER>  Followed by Dennison Mascot, she has been off her Arimidex & I don't have any recent notes from him to review...  Osteopenia & ?compression fx ~L1 on CXR> we will look into this later; prev on alendronate from DrRubin, for now rec continuing Calcium, MVI, Vit D 1000/d...  Anemia> ?etiology (never determined w/ certainty) but most likely her mod HH seen on CXR (prev EGD/ Colon 2006 showed smHH, divertics w/ stricture, no lesions seen); Hg now 10.5, MCV=73, Fe not done; we will continue RX w/ FESO4 325mg /d & take w/ Vit C 500mg /d; plan careful f/u Hg & could consider Fe infusion if nec...  Vit B12 defic> it was 188 in hosp 9/11 & started on B12 shots immed by the hospitalists; f/u level 765 shortly after disch but she never ret for continued shots; B12 level now 242 & we discussed trial of oral supplementation taking po daily...  Dementia> she is seeing DrPlovsky for Psyche> on Aricept 10mg /d.Marland KitchenMarland Kitchen

## 2010-12-26 ENCOUNTER — Other Ambulatory Visit: Payer: Self-pay | Admitting: Family Medicine

## 2011-01-07 ENCOUNTER — Ambulatory Visit: Payer: Medicare Other | Admitting: Pulmonary Disease

## 2011-02-03 ENCOUNTER — Emergency Department (HOSPITAL_BASED_OUTPATIENT_CLINIC_OR_DEPARTMENT_OTHER)
Admission: EM | Admit: 2011-02-03 | Discharge: 2011-02-03 | Disposition: A | Discharge status: Home / Self Care | Payer: Medicare Other | Attending: Emergency Medicine | Admitting: Emergency Medicine

## 2011-02-03 ENCOUNTER — Other Ambulatory Visit: Payer: Self-pay

## 2011-02-03 ENCOUNTER — Emergency Department (INDEPENDENT_AMBULATORY_CARE_PROVIDER_SITE_OTHER): Payer: Medicare Other

## 2011-02-03 ENCOUNTER — Encounter (HOSPITAL_BASED_OUTPATIENT_CLINIC_OR_DEPARTMENT_OTHER): Payer: Self-pay | Admitting: *Deleted

## 2011-02-03 DIAGNOSIS — I517 Cardiomegaly: Secondary | ICD-10-CM

## 2011-02-03 DIAGNOSIS — R42 Dizziness and giddiness: Secondary | ICD-10-CM | POA: Insufficient documentation

## 2011-02-03 DIAGNOSIS — R0789 Other chest pain: Secondary | ICD-10-CM

## 2011-02-03 DIAGNOSIS — J189 Pneumonia, unspecified organism: Secondary | ICD-10-CM | POA: Insufficient documentation

## 2011-02-03 DIAGNOSIS — R5383 Other fatigue: Secondary | ICD-10-CM

## 2011-02-03 DIAGNOSIS — R079 Chest pain, unspecified: Secondary | ICD-10-CM | POA: Insufficient documentation

## 2011-02-03 DIAGNOSIS — R112 Nausea with vomiting, unspecified: Secondary | ICD-10-CM | POA: Insufficient documentation

## 2011-02-03 DIAGNOSIS — G319 Degenerative disease of nervous system, unspecified: Secondary | ICD-10-CM

## 2011-02-03 DIAGNOSIS — R1110 Vomiting, unspecified: Secondary | ICD-10-CM

## 2011-02-03 DIAGNOSIS — J45909 Unspecified asthma, uncomplicated: Secondary | ICD-10-CM | POA: Insufficient documentation

## 2011-02-03 DIAGNOSIS — I10 Essential (primary) hypertension: Secondary | ICD-10-CM | POA: Insufficient documentation

## 2011-02-03 DIAGNOSIS — K589 Irritable bowel syndrome without diarrhea: Secondary | ICD-10-CM | POA: Insufficient documentation

## 2011-02-03 DIAGNOSIS — Z853 Personal history of malignant neoplasm of breast: Secondary | ICD-10-CM | POA: Insufficient documentation

## 2011-02-03 DIAGNOSIS — F0390 Unspecified dementia without behavioral disturbance: Secondary | ICD-10-CM | POA: Insufficient documentation

## 2011-02-03 DIAGNOSIS — Z79899 Other long term (current) drug therapy: Secondary | ICD-10-CM | POA: Insufficient documentation

## 2011-02-03 LAB — DIFFERENTIAL
Basophils Relative: 0 % (ref 0–1)
Eosinophils Absolute: 0.7 10*3/uL (ref 0.0–0.7)
Eosinophils Relative: 5 % (ref 0–5)
Monocytes Absolute: 1.2 10*3/uL — ABNORMAL HIGH (ref 0.1–1.0)
Monocytes Relative: 9 % (ref 3–12)
Neutrophils Relative %: 67 % (ref 43–77)

## 2011-02-03 LAB — URINALYSIS, ROUTINE W REFLEX MICROSCOPIC
Bilirubin Urine: NEGATIVE
Hgb urine dipstick: NEGATIVE
Specific Gravity, Urine: 1.012 (ref 1.005–1.030)
pH: 7 (ref 5.0–8.0)

## 2011-02-03 LAB — BASIC METABOLIC PANEL
BUN: 8 mg/dL (ref 6–23)
CO2: 22 mEq/L (ref 19–32)
Chloride: 98 mEq/L (ref 96–112)
Creatinine, Ser: 0.7 mg/dL (ref 0.50–1.10)
Glucose, Bld: 186 mg/dL — ABNORMAL HIGH (ref 70–99)
Potassium: 3.9 mEq/L (ref 3.5–5.1)

## 2011-02-03 LAB — CBC
HCT: 38.1 % (ref 36.0–46.0)
Hemoglobin: 12.5 g/dL (ref 12.0–15.0)
MCH: 27.8 pg (ref 26.0–34.0)
MCHC: 32.8 g/dL (ref 30.0–36.0)
MCV: 84.7 fL (ref 78.0–100.0)

## 2011-02-03 MED ORDER — CEPHALEXIN 500 MG PO CAPS: 500.0000 mg | ORAL_CAPSULE | Freq: Four times a day (QID) | ORAL | Status: AC

## 2011-02-03 MED ORDER — SODIUM CHLORIDE 0.9 % IV BOLUS (SEPSIS)
500.0000 mL | Freq: Once | INTRAVENOUS | Status: AC
  Administered 2011-02-03: 1000 mL via INTRAVENOUS

## 2011-02-03 MED ORDER — ONDANSETRON 8 MG PO TBDP
8.0000 mg | ORAL_TABLET | Freq: Once | ORAL | Status: AC
  Administered 2011-02-03: 8 mg via ORAL

## 2011-02-03 MED ORDER — DEXTROSE 5 % IV SOLN
500.0000 mg | Freq: Once | INTRAVENOUS | Status: AC
  Administered 2011-02-03: 500 mg via INTRAVENOUS
  Filled 2011-02-03: qty 500

## 2011-02-03 MED ORDER — SODIUM CHLORIDE 0.9 % IV SOLN
Freq: Once | INTRAVENOUS | Status: AC
  Administered 2011-02-03: 18:00:00 via INTRAVENOUS

## 2011-02-03 MED ORDER — ONDANSETRON 8 MG PO TBDP
ORAL_TABLET | ORAL | Status: AC
  Administered 2011-02-03: 8 mg via ORAL
  Filled 2011-02-03: qty 1

## 2011-02-03 MED ORDER — ONDANSETRON HCL 4 MG/2ML IJ SOLN
INTRAMUSCULAR | Status: AC
  Administered 2011-02-03: 4 mg
  Filled 2011-02-03: qty 2

## 2011-02-03 MED ORDER — ONDANSETRON 8 MG PO TBDP: 8.0000 mg | ORAL_TABLET | Freq: Three times a day (TID) | ORAL | Status: AC | PRN

## 2011-02-03 MED ORDER — DEXTROSE 5 % IV SOLN
1.0000 g | Freq: Once | INTRAVENOUS | Status: AC
  Administered 2011-02-03: 1 g via INTRAVENOUS
  Filled 2011-02-03: qty 10

## 2011-02-03 MED ORDER — AZITHROMYCIN 250 MG PO TABS: ORAL_TABLET | ORAL | Status: AC

## 2011-02-03 NOTE — ED Provider Notes (Signed)
History     CSN: [id number]  Arrival date & time 02/03/11  1644   First MD Initiated Contact with Patient 02/03/11 1655      Chief Complaint  Patient presents with  . Emesis    (Consider location/radiation/quality/duration/timing/severity/associated sxs/prior treatment) Patient is a 77 y.o. female presenting with vomiting. The history is provided by the patient and a relative.  Emesis  This is a new problem. Episode onset: The patient's daughter, who goes to her house daily to check on her, found patient in bed with complaint of nausea, vomiting and chest pain.  Episode frequency: She also had several bowel movements and reports being dizzy when walking to the bathroom. Vomiting appearance: No blood in emesis. There has been no fever. Pertinent negatives include no abdominal pain, no chills and no fever. Associated symptoms comments: The patient has a history of dementia but able to live by herself. She states that she doesn't feel well but denies chest pain in ED. Marland Kitchen    Past Medical History  Diagnosis Date  . Allergic rhinitis   . Asthma   . Hypertension   . Chronic rhinitis   . Venous insufficiency   . Hiatal hernia   . IBS (irritable bowel syndrome)   . Diverticulosis of colon   . History of breast cancer   . Low back pain syndrome   . Osteopenia   . Fatigue   . Headache   . Anxiety     Past Surgical History  Procedure Date  . Appendectomy 1950;s  . Right inguinal hernia repair   . Lumbar laminectomy 1989    Dr, Simonne Come  . Laparoscopic cholecystectomy 1992    Dr. Orson Slick  . Right breast lumpectomy/sentinel node bx 06/2005    Dr. Jamey Ripa    History reviewed. No pertinent family history.  History  Substance Use Topics  . Smoking status: Former Smoker -- 1.0 packs/day for 32 years    Types: Cigarettes    Quit date: 02/10/1985  . Smokeless tobacco: Not on file  . Alcohol Use: No    OB History    Grav Para Term Preterm Abortions TAB SAB Ect Mult Living               Review of Systems  Constitutional: Negative for fever and chills.  HENT: Negative.   Eyes: Negative.   Respiratory: Negative.  Negative for shortness of breath.   Cardiovascular: Positive for chest pain.  Gastrointestinal: Positive for vomiting. Negative for abdominal pain.  Genitourinary: Negative for dysuria.  Musculoskeletal: Negative.   Skin: Negative.   Neurological: Positive for dizziness.    Allergies  Levofloxacin  Home Medications   Current Outpatient Rx  Name Route Sig Dispense Refill  . ADVAIR DISKUS 500-50 MCG/DOSE IN AEPB  INHALE 1 PUFF TWICE A DAY 60 each 0    Office visit due now  . CALCIUM CARBONATE-VITAMIN D 600-400 MG-UNIT PO TABS Oral Take 1 tablet by mouth 2 (two) times daily.      Marland Kitchen VITAMIN D 1000 UNITS PO TABS Oral Take 1,000 Units by mouth daily.      . DONEPEZIL HCL 10 MG PO TABS Oral Take 10 mg by mouth at bedtime.     Marland Kitchen METOPROLOL TARTRATE 50 MG PO TABS Oral Take 1 tablet (50 mg total) by mouth 2 (two) times daily. 60 tablet 11  . WOMENS MULTIVITAMIN PLUS PO TABS Oral Take 1 tablet by mouth daily.      Marland Kitchen OMEPRAZOLE 20 MG PO  CPDR Oral Take 1 capsule (20 mg total) by mouth daily. 30 capsule 11  . AZITHROMYCIN 250 MG PO TABS  Take one table per day for the next 4 days (starting February 03, 2011) 4 each 0  . CEPHALEXIN 500 MG PO CAPS Oral Take 1 capsule (500 mg total) by mouth 4 (four) times daily. 20 capsule 0  . ONDANSETRON 8 MG PO TBDP Oral Take 1 tablet (8 mg total) by mouth every 8 (eight) hours as needed for nausea. 10 tablet 0    BP 129/82  Pulse 64  Temp(Src) 97.4 F (36.3 C) (Oral)  Resp 12  SpO2 98%  Physical Exam  Constitutional: She appears well-developed and well-nourished.  HENT:  Head: Normocephalic and atraumatic.  Mouth/Throat: Mucous membranes are dry.  Neck: Normal range of motion. Neck supple.  Cardiovascular: Normal rate and regular rhythm.   Pulmonary/Chest: Effort normal. She has rales. She exhibits no  tenderness.  Abdominal: Soft. Bowel sounds are normal. There is no tenderness. There is no rebound and no guarding.  Musculoskeletal: Normal range of motion. She exhibits no edema.  Neurological: She is alert. No cranial nerve deficit.  Skin: Skin is warm and dry. No rash noted. She is not diaphoretic.  Psychiatric: She has a normal mood and affect.    ED Course  Procedures (including critical care time)  Labs Reviewed  CBC - Abnormal; Notable for the following:    WBC 13.7 (*)    RDW 15.6 (*)    Platelets 462 (*)    All other components within normal limits  DIFFERENTIAL - Abnormal; Notable for the following:    Neutro Abs 9.1 (*)    Monocytes Absolute 1.2 (*)    All other components within normal limits  BASIC METABOLIC PANEL - Abnormal; Notable for the following:    Glucose, Bld 186 (*)    GFR calc non Af Amer 83 (*)    All other components within normal limits  URINALYSIS, ROUTINE W REFLEX MICROSCOPIC - Abnormal; Notable for the following:    Glucose, UA 250 (*)    Ketones, ur 40 (*)    All other components within normal limits  OCCULT BLOOD X 1 CARD TO LAB, STOOL  PROTIME-INR  APTT  TROPONIN I   Ct Head Wo Contrast  02/03/2011  *RADIOLOGY REPORT*  Clinical Data: Onset nausea and vomiting.  History of dementia.  CT HEAD WITHOUT CONTRAST  Technique:  Contiguous axial images were obtained from the base of the skull through the vertex without contrast.  Comparison: 03/05/2010.  Findings: No mass lesion, mass effect, midline shift, hydrocephalus, hemorrhage.  No acute territorial cortical ischemia/infarct. Atrophy and chronic ischemic white matter disease is present.  Benign basal ganglia calcifications are present. Chronic paranasal sinus disease with mucoperiosteal thickening.  IMPRESSION: Atrophy and chronic ischemic white matter disease without acute intracranial abnormality.  Original Report Authenticated By: Andreas Newport, M.D.   Dg Chest Portable 1 View  02/03/2011   *RADIOLOGY REPORT*  Clinical Data: Sudden onset of vomiting, chest discomfort, history breast carcinoma  PORTABLE CHEST - 1 VIEW  Comparison: Chest x-ray of 10/03/2010  Findings: There are prominent markings at the left lung base suspicious for left lower lobe pneumonia.  The heart is mildly enlarged.  There is also minimal pulmonary vascular congestion present.  No acute bony abnormality is seen.  IMPRESSION: Probable left lower lobe pneumonia.  Cardiomegaly and question of mild pulmonary vascular congestion as well.  Original Report Authenticated By: Juline Patch,  M.D.     1. Community acquired pneumonia       MDM  The patient had a large bowel movement here that did not appear melanic. She had persistent nausea without vomiting, which was better with medications. She continues to improve through her visit here and eventually is able to tolerate PO fluids. CXR showing LLL pna. IV abx given. Discussed discharge home with family who is comfortable with plan. The patient ambulates without dizziness or recurrent nausea. She states she feels fine and does not remember why she is here. Will discharge home with recommendation to recheck with primary care doctor in 2 days.         Rodena Medin, PA 02/04/11 0000

## 2011-02-03 NOTE — ED Notes (Signed)
Sudden onset vomiting. Pale. States her chest feels funny.

## 2011-02-04 NOTE — ED Provider Notes (Signed)
Medical screening examination/treatment/procedure(s) were conducted as a shared visit with non-physician practitioner(s) and myself.  I personally evaluated the patient during the encounter  Cyndra Numbers, MD 02/04/11 1457

## 2011-02-12 ENCOUNTER — Ambulatory Visit: Payer: Medicare Other | Admitting: Pulmonary Disease

## 2011-03-04 ENCOUNTER — Ambulatory Visit (INDEPENDENT_AMBULATORY_CARE_PROVIDER_SITE_OTHER): Payer: Medicare Other | Admitting: Pulmonary Disease

## 2011-03-04 ENCOUNTER — Encounter: Payer: Self-pay | Admitting: Pulmonary Disease

## 2011-03-04 DIAGNOSIS — K449 Diaphragmatic hernia without obstruction or gangrene: Secondary | ICD-10-CM

## 2011-03-04 DIAGNOSIS — I872 Venous insufficiency (chronic) (peripheral): Secondary | ICD-10-CM

## 2011-03-04 DIAGNOSIS — J45909 Unspecified asthma, uncomplicated: Secondary | ICD-10-CM

## 2011-03-04 DIAGNOSIS — M545 Low back pain: Secondary | ICD-10-CM

## 2011-03-04 DIAGNOSIS — K5730 Diverticulosis of large intestine without perforation or abscess without bleeding: Secondary | ICD-10-CM

## 2011-03-04 DIAGNOSIS — M1990 Unspecified osteoarthritis, unspecified site: Secondary | ICD-10-CM

## 2011-03-04 DIAGNOSIS — I10 Essential (primary) hypertension: Secondary | ICD-10-CM

## 2011-03-04 DIAGNOSIS — K589 Irritable bowel syndrome without diarrhea: Secondary | ICD-10-CM

## 2011-03-04 DIAGNOSIS — C50919 Malignant neoplasm of unspecified site of unspecified female breast: Secondary | ICD-10-CM

## 2011-03-04 DIAGNOSIS — D508 Other iron deficiency anemias: Secondary | ICD-10-CM

## 2011-03-04 NOTE — Patient Instructions (Signed)
Today we updated your med list in our EPIC system...    Continue your current medications the same...    Add the OTC MUCINEX 1-2 tabs twice daily & drink plenty of fluids...  We will sched a Bone Density test to check your bones & we will call you w/ the result when avail...  Call for any questions...  Let's plan another check up in 4 months.Marland KitchenMarland Kitchen

## 2011-03-04 NOTE — Progress Notes (Signed)
Subjective:    Patient ID: [name], female    DOB: 11/18/1933, 78 y.o.   MRN: [id number]  HPI 78 y/o WF here to re-establish care after a >79yr hiatus... she has multiple medical problems as noted below & she also sees DrJHayes for GI, DrRKaplan for GYN, and DrRubin for Breast Cancer.  ~  October 02, 2010:  >45yr ROV & brought in by her daughter [name] (work# [phone], cell# [phone]); there is much interval history gleaned from daughter, [employer] EMR records, [employer] records & TC w/ DrJHayes>    9/11:  Family took her to ER w/ weakness, nausea, FTT at home> eval revealed severe microcytic anemia w/ Hg=5.3, MCV=60, Fe/Tibc weren't done; she was transfused & GI consulted (DrGanem, DrJHayes); stool hemoccult was NEG, PT/INR & LFTs were normal; pt refused EGD/ Colon & was obviously demented & difficult to deal w/ in the Hosp> Psychiatry was called & she was deemed incompetent, [name] declared her Guardian by the Court;  She was stabilized & disch home on Metoprolol, Protonix, Femara, Xanax, B12 tabs...    10/11:  She was seen post hosp by DrLowne just one time> Hg=11.6, MCV=74, Fe=30, TIBC=309, B12=765, VitD=23; she was given a Vit B12 shot & a PNEUMOVAX injection and asked to ret for monthly B12 shots but she never returned...    2/12:  She went to see DrJHayes w/ Hg reported 8.4 & she was relatively asymptomatic; started on Iron, stools negx6; he recommended repeat EGD/ Colon, had it sched several times but she repeatedly cancelled/ refused/ etc; her Hg dropped to 7.7, then 6.3, had 2 of 6 repeat stool cards pos for occult blood, & she agreed to transfusion- done as outpt 6/12...    8/12:  Now daughter brings her back in to see me & I spent several hours piecing together her story >>    Asthma> this has NOT been an issue but oddly enough the Advair is one of the few meds she will take regularly w/o being prompted...    HBP> controlled well on the Metoprolol 50mg Bid.    HH seen on CXR> may  be source of GI blood loss & her iron defic anemia; rec OMEPRAZOLE 20mg /d for now...    Osteopenia & ?compression fx ~L1 on CXR> we will look into this later, for now rec continuing Calcium, MVI, Vit D 1000/d...    Anemia> ?etiology (never determined w/ certainty) but most likely her mod HH seen on CXR (prev EGD/ Colon 2006 showed smHH, divertics w/ stricture, no lesions seen); Hg now 10.5, MCV=73, Fe not done; we will continue RX w/ FESO4 325mg /d & take w/ Vit C 500mg /d; plan careful f/u Hg & could consider Fe infusion if nec...    Vit B12 defic> it was 188 in hosp 9/11 & started on B12 shots immed by the hospitalists; f/u level 765 shortly after disch but she never ret for continued shots; B12 level now 242 & we discussed trial of oral supplementation taking po daily...    Dementia> she is seeing DrPlovsky for Psyche> on Aricept 10mg /d...  ~  November 04, 2010:  41mo ROV & she is feeling sl better just c/o some allergy symptoms; she is getting some exercise walking "[name]" her Cocker spaniel up the hill & back she says...  Breathing is stable on her Advair & Mucinex;  BP is acceptable on Metop50Bid;  She denies GI symptoms on her Omep & stools dark from the Fe;  She continues  to see DrPlovsky on the Aricept;  She notes fair appetite & wt is down 10# to 133# today & asked to start dietary supplements...  We decided to continue same meds and recheck in 168mo w/ f/u labs...  ~  March 04, 2011:  68mo ROV & [place] visited [facility] 12[facility] w/ n/v, palpit, weakness> review of record showed rales in chest, neg abd exam, Wbc=13.7, Hg=12.5, CXR w/ ?LLLpneumonia, CTBrain w/ atrophy/ sm vessel dis/ NAD;  She was treated w/ IVfluids, & given ZPak & Amox at disch, slowly improved, back to baseline...    Today she seems much improved, more jovial & spontaneous, states she is doing well, & daughter confirms, wt stable 132# w/ Ensure supplements...    Asthma/ ?COPD> on Advair500Bid; improved after episode  above 7 back to baseline...    HBP> controlled on Metop50Bid; BP= 142/68 today & daugh monitors her meds at home...    HH> on Prilosec20/d; prev n/v resolved, denies choking episode/ trouble swallowing/ etc...    Divertics/IBS> she has known divertics & narrowing in sigmoid area; reminded to stay on Miralax/ Senakot-S/ etc...    Hx Breast Cancer> followed by DrRubin...    DJD, LBP> known degen lumbar spondylosis etc; part compression fx seen in lower thor vert on CXR...    Osteopenia> due for f/u BMD & consideration of Bisphos therapy again; reminded to take Calcium, MVI, VitD...    Dementia> followed by DrPlovsky on Aricept10 & she has definitely stabilized/ improved...    Anemia> etiology never fully determined but likely related to large HH; on FeSO4 daily             Current Problems:   << ALLERGIC reaction to CIPRO in the past w/ HIVES >>  ALLERGIC RHINITIS (ICD-477.9) - prev allergy eval yrs ago by DrESL & on shots for awhile. CHRONIC RHINITIS (ICD-472.0) - prev ENT eval 2005 by DrRosen for recurrent sinus infections, pressure in her head & HA's... he wanted to do Sinus CT but she declined... Rx w/ antihist/ saline/ Nasonex...  ASTHMA (ICD-493.90) - controlled on ADVAIR500 Bid; she no longer has a rescue inhaler; denies breathing difficulty. Cough, sputum, hemoptysis, etc... ~  CXR 5/07 (pre-op for breast surg) showed mild chronic changes, scarring left base, NAD.Marland Kitchen. ~  CXR 8/12 showed mod HH seen behind the heart, mild scarring vs atx left base, wedging of lower TSpine vert body... ~  12/12: went to ER w/ ?n/v/weakness; CXR- ?LLLinfiltrate vs scarring/atx; improved w/ ZPak & Amox...  HYPERTENSION (ICD-401.9) - controlled on LOPRESSOR 50mg  Bid... she was seen by [facility] in the 1990's... ~  2DEcho 4/91 showed mild MVP w/ myxomatous ant leaflet & mild MR... ~  Myocardial Perfusion Scan 5/95 was WNL showing normal contractility & uptake in all myocardial segments. ~  1/13:  BP= 142/68 & she  states asymptomatic w/o CP, palpit, SOB, edema, etc...  SUPRAVENTRICULAR TACHYCARDIA >> she developed a transient SVT 9/11 during her Hosp for anemia; subseq EKG showed NSR w/ PACs & minor NSSTTWA.  VENOUS INSUFFICIENCY (ICD-459.81) - she knows to avoid sodium, elevate legs, wear support hose when able...  HIATAL HERNIA (ICD-553.3) - she uses PRILOSEC 20mg /d...  ~  EGD 11/06 by DrJHayes showed sm HH, otherw normal, no evid of Barrett's... ~  CXR 8/12 showed ?mod sized HH behind the heart; ?source of her iron defic anemia? (only 2/18 stool cards pos for occult blood over the past yr).  IRRITABLE BOWEL SYNDROME (ICD-564.1) DIVERTICULOSIS OF COLON (ICD-562.10) - reminded  to use Miralax/ Senakot-S as needed... ~  11/05: she was [facility] w/ diverticulitis & abscess> Rx'd w/ antibiotics & improved; DrStreck considered sigmoid colectomy but she improved w/ high fiber diet etc... ~  Colonoscopy 11/06 by [facility] showed divertic stricture in the rectosigmoid junction, scope passed only to the mid transverse colon...  ~  11/06: subseq BE showed innumerable divertics, some narrowing in the sigmoid region, otherw neg...  Hx of BREAST CANCER (ICD-174.9) - off ARIMEDEX per DrRubin... she had an abn mammogram in 2007 w/ subseq right lumpectomy & sentinel node bx by DrStreck pos for invasive ductal cancer- stage I... post op XRT from DrWu, and followed by DrRubin ever since then...   DEGENERATIVE ARTHRITIS >> ~  XRay of right hip 9/07 showed marked degenerative changes w/ narrowing, sclerosis, spur formation...  LOW BACK PAIN SYNDROME (ICD-724.2) - she had lumbar surgery in 1989 by DrAplington w/ recurrent back and leg pain in 2006 & was evaluated by DrAplington & DrNudelman- he rec conservative management as the required surgery would be quite extensive... she saw DrKirshmayer in the pain management cliic as well... ~  MRI Lumbar Spine 9/07 showed degenerative lumbar spondylosis w/ assoc DDD & degen facet  arthritis; multilevel spinal, lat recess, & foraminal stenoses at various levels... ~  CXR reports wedge deformity of lower thoracic vert, she denies any acute pain...  OSTEOPENIA (ICD-733.90) -  DrRubin had her on ALENDRONATE weekly + caltrate & MVI> but this was stopped ?when ?why... ~  BMD at [facility] by DrRubin 7/07 showed osteopenia w/ TScores -1.5 in Spine, & -1.3 in right hip... ~  1/13: We will sched f/u BMD==> pending...  DEMENTIA >> on ARICEPT 10mg /d... ~  CT Brain 9/11 by the hospitalists showed generalized atrophy, chronic small vessel disease, NAD (no mets), chronic sinusitis... ~  CT Brain 1/12 by DrPlovsky showed some atrophy, small vessel ischemic disease, no acute infarcts; there was also evid of extensive chronic sinusitis ~  1/13: she seems better- brighter, more spontaneous, etc...  ANEMIA >> Fe-deficient anemia on FeSO4 325mg /d... ~  [facility] 9/11 w/ severe microcytic anemia ?etiology; Hg=5.3, MCV=60; Fe/Tibc weren't done; transfused ?units of ABpos blood & final Hg=11.4, MCV=71... ~  In the [facility] 9/11 her B12 level was 188 & she was given B12 shots w/o further eval; B12 level improved to 765 w/ the shots... ~  She saw DrLowne in the office 10/11 & was sched for B12 shots to start but she never returned... ~  Starting in 2/12 she was re-eval by [facility] w/ Hg=8.4, she no showed/ cancelled/ refused several attempts to sched EGD/ Colon & Hg dropped t 7.7 then 6.3; she was transfused as outpt then lost to f/u until her 8/12 OV w/ here... ~  Labs 8/12 showed Hg=10.5, MCV=73, Fe=not done> we decided on FeSO4 325mg /d w/ Vit C 500mg /d  VITAMIN B12 Deficiency >> asked to take B12 OTC oral supplement ~  Labs in hosp 9/11 showed Vit B12 level = 188; she was immed started on shots by the hospitalists; f/u B12 level 10/11 was 765 & DrLowne planned monthly shots but she never showed up... ~  Labs here 8/12 showed B12 level = 242 and we decided to supplement orally daily for  now...   Past Surgical History  Procedure Date  . Appendectomy 1950;s  . Right inguinal hernia repair   . Lumbar laminectomy 1989    Dr, Simonne Come  . Laparoscopic cholecystectomy 1992    Dr. Orson Slick  .  Right breast lumpectomy/sentinel node bx 06/2005    Dr. Jamey Ripa    Outpatient Encounter Prescriptions as of 03/04/2011  Medication Sig Dispense Refill  . ADVAIR DISKUS 500-50 MCG/DOSE AEPB INHALE 1 PUFF TWICE A DAY  60 each  0  . Calcium Carbonate-Vitamin D (CALTRATE 600+D) 600-400 MG-UNIT per tablet Take 1 tablet by mouth 2 (two) times daily.        Marland Kitchen donepezil (ARICEPT) 10 MG tablet Take 10 mg by mouth at bedtime.       . ferrous sulfate 325 (65 FE) MG tablet Take 325 mg by mouth daily with breakfast.      . metoprolol (LOPRESSOR) 50 MG tablet Take 1 tablet (50 mg total) by mouth 2 (two) times daily.  60 tablet  11  . omeprazole (PRILOSEC) 20 MG capsule Take 1 capsule (20 mg total) by mouth daily.  30 capsule  11  . cholecalciferol (VITAMIN D) 1000 UNITS tablet Take 1,000 Units by mouth daily.        Marland Kitchen DISCONTD: Multiple Vitamins-Minerals (WOMENS MULTIVITAMIN PLUS) TABS Take 1 tablet by mouth daily.          Allergies  Allergen Reactions  . Levofloxacin     REACTION: dizziness    Current Medications, Allergies, Past Medical History, Past Surgical History, Family History, and Social History were reviewed in [employer] record.   Review of Systems       See HPI - all other systems neg except as noted...       The patient complains of some weakness, & dyspnea on exertion.  The patient denies anorexia, fever, weight loss, weight gain, vision loss, decreased hearing, hoarseness, chest pain, syncope, peripheral edema, prolonged cough, headaches, hemoptysis, abdominal pain, melena, hematochezia, severe indigestion/heartburn, hematuria, incontinence, muscle weakness, suspicious skin lesions, transient blindness, difficulty walking, depression, unusual weight change,  abnormal bleeding, enlarged lymph nodes, and angioedema.   Objective:   Physical Exam    WD, sl thin & pale, 78 y/o WF in NAD... she is chronically ill appearing. GENERAL:  Alert & mostly cooperative, obvious dementia/ some confusion. HEENT:  [place]/AT, EOM-wnl, PERRLA, EACs-clear, TMs-wnl, NOSE- sl congested, THROAT-clear & wnl. NECK:  Supple w/ fairROM; no JVD; normal carotid impulses w/o bruits; no thyromegaly or nodules palpated; no lymphadenopathy. CHEST:  Clear to P & A; without wheezes or rales- few rhonchi scattered bilat... HEART:  Regular Rhythm; without murmurs/ rubs/ or gallops heard... ABDOMEN:  Soft & nontender; normal bowel sounds; no organomegaly or masses detected. EXT: without deformities, mild arthritic changes; no varicose veins/ +venous insuffic/ no edema. NEURO:  CN's intact; motor testing normal; sensory testing normal; gait normal & balance OK. DERM:  No lesions noted; no rash etc...  MMSE 10/02/10 >> Date 2012 Aug ?day; she didn't know the Pres/ VP/ Gov/ or prev administration; remembered 3/3 objects immed but 0/3 after distraction; serial 7's= [id number]?; WORLD backwards= DLROW; proverbs were mixed- one abstracted (glass houses), others concrete (stitch, etc)...   RADIOLOGY DATA:  Reviewed in the EPIC EMR & discussed w/ the patient & daughter...  LABORATORY DATA:  Reviewed in the EPIC EMR & discussed w/ the patient & daughter...   Assessment & Plan:   Asthma> this has NOT been an issue but she has some bibasilar congestion & asked to add MUCINEX 1-2Bid w/ fluids...  HBP> BP controlled well on the Metoprolol 50mg Bid...  HH seen on CXR> may be source of GI blood loss & her iron defic anemia; rec OMEPRAZOLE 20mg /d  for now...  IBS, Divertics w/ stricture in sigmoid>  See 2005 hosp for diverticulitis, then subseq work up by [facility] in 2006; states she is regular w/o difficulty.  BREAST CANCER>  Followed by Dennison Mascot, she has been off her Arimidex & I don't have any  recent notes from him to review...  Osteopenia & ?compression fx ~L1 on CXR> prev on alendronate from DrRubin, for now rec continuing Calcium, MVI, Vit D 1000/d & we will sched f/u BMD.  Anemia> ?etiology (never determined w/ certainty) but most likely her mod HH seen on CXR (prev EGD/ Colon 2006 showed smHH, divertics w/ stricture, no lesions seen); Hg now 12.5, MCV=85==> continue the Fe daily for now...  Vit B12 defic> it was 188 in hosp 9/11 & started on B12 shots immed by the hospitalists; f/u level 765 shortly after disch but she never ret for continued shots; B12 level now 242 & we discussed trial of oral supplementation taking po daily...  Dementia> she is seeing DrPlovsky for Psyche> on Aricept 10mg /d...   Patient's Medications  New Prescriptions   No medications on file  Previous Medications   ADVAIR DISKUS 500-50 MCG/DOSE AEPB    INHALE 1 PUFF TWICE A DAY   CALCIUM CARBONATE-VITAMIN D (CALTRATE 600+D) 600-400 MG-UNIT PER TABLET    Take 1 tablet by mouth 2 (two) times daily.     CHOLECALCIFEROL (VITAMIN D) 1000 UNITS TABLET    Take 1,000 Units by mouth daily.     DONEPEZIL (ARICEPT) 10 MG TABLET    Take 10 mg by mouth at bedtime.    FERROUS SULFATE 325 (65 FE) MG TABLET    Take 325 mg by mouth daily with breakfast.   METOPROLOL (LOPRESSOR) 50 MG TABLET    Take 1 tablet (50 mg total) by mouth 2 (two) times daily.   OMEPRAZOLE (PRILOSEC) 20 MG CAPSULE    Take 1 capsule (20 mg total) by mouth daily.  Modified Medications   No medications on file  Discontinued Medications   MULTIPLE VITAMINS-MINERALS (WOMENS MULTIVITAMIN PLUS) TABS    Take 1 tablet by mouth daily.    She is asked to restart Calcium, Women's MVI, Vit D 1000u daily & B-12 1023mcg/d oral supplements.Marland KitchenMarland Kitchen

## 2011-03-12 ENCOUNTER — Inpatient Hospital Stay: Admission: RE | Admit: 2011-03-12 | Payer: Medicare Other | Source: Ambulatory Visit

## 2011-03-19 ENCOUNTER — Inpatient Hospital Stay: Admission: RE | Admit: 2011-03-19 | Payer: Medicare Other | Source: Ambulatory Visit

## 2011-03-26 ENCOUNTER — Ambulatory Visit (INDEPENDENT_AMBULATORY_CARE_PROVIDER_SITE_OTHER)
Admission: RE | Admit: 2011-03-26 | Discharge: 2011-03-26 | Disposition: A | Discharge status: Home / Self Care | Payer: Medicare Other | Source: Ambulatory Visit | Attending: Pulmonary Disease | Admitting: Pulmonary Disease

## 2011-03-26 DIAGNOSIS — M1990 Unspecified osteoarthritis, unspecified site: Secondary | ICD-10-CM

## 2011-03-26 DIAGNOSIS — M899 Disorder of bone, unspecified: Secondary | ICD-10-CM

## 2011-04-02 ENCOUNTER — Encounter: Payer: Self-pay | Admitting: Pulmonary Disease

## 2011-04-03 ENCOUNTER — Other Ambulatory Visit: Payer: Self-pay | Admitting: *Deleted

## 2011-04-03 MED ORDER — ALENDRONATE SODIUM 70 MG PO TABS: 70.0000 mg | ORAL_TABLET | ORAL | Status: AC

## 2011-08-28 ENCOUNTER — Encounter: Payer: Self-pay | Admitting: Pulmonary Disease

## 2011-08-28 ENCOUNTER — Ambulatory Visit (INDEPENDENT_AMBULATORY_CARE_PROVIDER_SITE_OTHER): Payer: Medicare Other | Admitting: Pulmonary Disease

## 2011-08-28 VITALS — BP 126/64 | HR 62 | Temp 96.8°F | Ht 66.0 in | Wt 136.8 lb

## 2011-08-28 DIAGNOSIS — K589 Irritable bowel syndrome without diarrhea: Secondary | ICD-10-CM

## 2011-08-28 DIAGNOSIS — M949 Disorder of cartilage, unspecified: Secondary | ICD-10-CM

## 2011-08-28 DIAGNOSIS — F068 Other specified mental disorders due to known physiological condition: Secondary | ICD-10-CM

## 2011-08-28 DIAGNOSIS — M1990 Unspecified osteoarthritis, unspecified site: Secondary | ICD-10-CM

## 2011-08-28 DIAGNOSIS — D508 Other iron deficiency anemias: Secondary | ICD-10-CM

## 2011-08-28 DIAGNOSIS — K5730 Diverticulosis of large intestine without perforation or abscess without bleeding: Secondary | ICD-10-CM

## 2011-08-28 DIAGNOSIS — K449 Diaphragmatic hernia without obstruction or gangrene: Secondary | ICD-10-CM

## 2011-08-28 DIAGNOSIS — M545 Low back pain: Secondary | ICD-10-CM

## 2011-08-28 DIAGNOSIS — C50919 Malignant neoplasm of unspecified site of unspecified female breast: Secondary | ICD-10-CM

## 2011-08-28 DIAGNOSIS — I10 Essential (primary) hypertension: Secondary | ICD-10-CM

## 2011-08-28 DIAGNOSIS — J45909 Unspecified asthma, uncomplicated: Secondary | ICD-10-CM

## 2011-08-28 NOTE — Patient Instructions (Addendum)
Today we updated your med list in our EPIC system...    Continue your current medications the same...  For your back discomfort:    Try heating pad, Icey Hot patches, Ben-gay, etc...    Ok to use Advil, Aleve, Tylenol, etc...    Call if there is any trauma or worsening pain...  Stay as active as possible, and BE SAFE...  Let's plan a follow up visit in 6 months w/ blood work at that time.Marland KitchenMarland Kitchen

## 2011-09-06 ENCOUNTER — Encounter: Payer: Self-pay | Admitting: Pulmonary Disease

## 2011-09-06 NOTE — Progress Notes (Signed)
Subjective:    Patient ID: [name], female    DOB: [birth year], 78 y.o.   MRN: [id number]  HPI 78 y/o WF here to re-establish care after a >44yr hiatus... she has multiple medical problems as noted below & she also sees DrJHayes for GI, DrRKaplan for GYN, and DrRubin for Breast Cancer.  ~  October 02, 2010:  >60yr ROV & brought in by her daughter [name] (work# [phone], cell# [phone]); there is much interval history gleaned from daughter, [employer] EMR records, [employer] records & TC w/ DrJHayes>    9/11:  Family took her to ER w/ weakness, nausea, FTT at home> eval revealed severe microcytic anemia w/ Hg=5.3, MCV=60, Fe/Tibc weren't done; she was transfused & GI consulted (DrGanem, DrJHayes); stool hemoccult was NEG, PT/INR & LFTs were normal; pt refused EGD/ Colon & was obviously demented & difficult to deal w/ in the Hosp> Psychiatry was called & she was deemed incompetent, [name] declared her Guardian by the Court;  She was stabilized & disch home on Metoprolol, Protonix, Femara, Xanax, B12 tabs...    10/11:  She was seen post hosp by DrLowne just one time> Hg=11.6, MCV=74, Fe=30, TIBC=309, B12=765, VitD=23; she was given a Vit B12 shot & a PNEUMOVAX injection and asked to ret for monthly B12 shots but she never returned...    2/12:  She went to see DrJHayes w/ Hg reported 8.4 & she was relatively asymptomatic; started on Iron, stools negx6; he recommended repeat EGD/ Colon, had it sched several times but she repeatedly cancelled/ refused/ etc; her Hg dropped to 7.7, then 6.3, had 2 of 6 repeat stool cards pos for occult blood, & she agreed to transfusion- done as outpt 6/12...    8/12:  Now daughter brings her back in to see me & I spent several hours piecing together her story >>    Asthma> this has NOT been an issue but oddly enough the Advair is one of the few meds she will take regularly w/o being prompted...    HBP> controlled well on the Metoprolol 50mg Bid.    HH seen on CXR> may  be source of GI blood loss & her iron defic anemia; rec OMEPRAZOLE 20mg /d for now...    Osteopenia & ?compression fx ~L1 on CXR> we will look into this later, for now rec continuing Calcium, MVI, Vit D 1000/d...    Anemia> ?etiology (never determined w/ certainty) but most likely her mod HH seen on CXR (prev EGD/ Colon 2006 showed smHH, divertics w/ stricture, no lesions seen); Hg now 10.5, MCV=73, Fe not done; we will continue RX w/ FESO4 325mg /d & take w/ Vit C 500mg /d; plan careful f/u Hg & could consider Fe infusion if nec...    Vit B12 defic> it was 188 in hosp 9/11 & started on B12 shots immed by the hospitalists; f/u level 765 shortly after disch but she never ret for continued shots; B12 level now 242 & we discussed trial of oral supplementation taking po daily...    Dementia> she is seeing DrPlovsky for Psyche> on Aricept 10mg /d...  ~  November 04, 2010:  79mo ROV & she is feeling sl better just c/o some allergy symptoms; she is getting some exercise walking "[name]" her Cocker spaniel up the hill & back she says...  Breathing is stable on her Advair & Mucinex;  BP is acceptable on Metop50Bid;  She denies GI symptoms on her Omep & stools dark from the Fe;  She continues  to see DrPlovsky on the Aricept;  She notes fair appetite & wt is down 10# to 133# today & asked to start dietary supplements...  We decided to continue same meds and recheck in 569mo w/ f/u labs...  ~  March 04, 2011:  44mo ROV & [name] visited [facility] w/ n/v, palpit, weakness> review of record showed rales in chest, neg abd exam, Wbc=13.7, Hg=12.5, CXR w/ ?LLLpneumonia, CTBrain w/ atrophy/ sm vessel dis/ NAD;  She was treated w/ IVfluids, & given ZPak & Amox at disch, slowly improved, back to baseline...    Today she seems much improved, more jovial & spontaneous, states she is doing well, & daughter confirms, wt stable 132# w/ Ensure supplements...    Asthma/ ?COPD> on Advair500Bid; improved after episode  above 7 back to baseline...    HBP> controlled on Metop50Bid; BP= 142/68 today & daugh monitors her meds at home...    HH> on Prilosec20/d; prev n/v resolved, denies choking episode/ trouble swallowing/ etc...    Divertics/IBS> she has known divertics & narrowing in sigmoid area; reminded to stay on Miralax/ Senakot-S/ etc...    Hx Breast Cancer> followed by DrRubin...    DJD, LBP> known degen lumbar spondylosis etc; part compression fx seen in lower thor vert on CXR...    Osteopenia> due for f/u BMD & consideration of Bisphos therapy again; reminded to take Calcium, MVI, VitD...    Dementia> followed by DrPlovsky on Aricept10 & she has definitely stabilized/ improved...    Anemia> etiology never fully determined but likely related to large HH; on FeSO4 daily  ~  August 28, 2011:  69mo ROV & [name] is basically stable, c/o some back discomfort today, no known trauma etc & we rec Rest, Heat, Tylenol, Advil, etc... She had BMD rechecked 2/13 w/ TScore -2.9 in left FemNeck & FOSAMAX 70mg /wk restarted... She is eating better & weight up 5# to 137# today...    We reviewed prob list, meds, xrays and labs> see below for updates >>          Current Problems:   << ALLERGIC reaction to CIPRO in the past w/ HIVES >>  ALLERGIC RHINITIS (ICD-477.9) - prev allergy eval yrs ago by DrESL & on shots for awhile. CHRONIC RHINITIS (ICD-472.0) - prev ENT eval 2005 by DrRosen for recurrent sinus infections, pressure in her head & HA's... he wanted to do Sinus CT but she declined... Rx w/ antihist/ saline/ Nasonex...  ASTHMA (ICD-493.90) - controlled on ADVAIR500 Bid; she no longer has a rescue inhaler; denies breathing difficulty. Cough, sputum, hemoptysis, etc... ~  CXR 5/07 (pre-op for breast surg) showed mild chronic changes, scarring left base, NAD.Marland Kitchen. ~  CXR 8/12 showed mod HH seen behind the heart, mild scarring vs atx left base, wedging of lower TSpine vert body... ~  12/12: went to ER w/ ?n/v/weakness; CXR-  ?LLLinfiltrate vs scarring/atx; improved w/ ZPak & Amox...  HYPERTENSION (ICD-401.9) - controlled on LOPRESSOR 50mg  Bid... she was seen by [facility] in the 1990's... ~  2DEcho 4/91 showed mild MVP w/ myxomatous ant leaflet & mild MR... ~  Myocardial Perfusion Scan 5/95 was WNL showing normal contractility & uptake in all myocardial segments. ~  1/13:  BP= 142/68 & she states asymptomatic w/o CP, palpit, SOB, edema, etc... ~  7/13:  BP= 126/64 & doing well overall she says...  SUPRAVENTRICULAR TACHYCARDIA >> she developed a transient SVT 9/11 during her Hosp for anemia; subseq EKG showed NSR w/ PACs & minor NSSTTWA.  VENOUS INSUFFICIENCY (ICD-459.81) - she knows to avoid sodium, elevate legs, wear support hose when able...  HIATAL HERNIA (ICD-553.3) - she uses PRILOSEC 20mg /d...  ~  EGD 11/06 by DrJHayes showed sm HH, otherw normal, no evid of Barrett's... ~  CXR 8/12 showed ?mod sized HH behind the heart; ?source of her iron defic anemia? (only 2/18 stool cards pos for occult blood over the past yr).  IRRITABLE BOWEL SYNDROME (ICD-564.1) DIVERTICULOSIS OF COLON (ICD-562.10) - reminded to use Miralax/ Senakot-S as needed... ~  11/05: she was [facility] w/ diverticulitis & abscess> Rx'd w/ antibiotics & improved; DrStreck considered sigmoid colectomy but she improved w/ high fiber diet etc... ~  Colonoscopy 11/06 by [facility] showed divertic stricture in the rectosigmoid junction, scope passed only to the mid transverse colon...  ~  11/06: subseq BE showed innumerable divertics, some narrowing in the sigmoid region, otherw neg...  Hx of BREAST CANCER (ICD-174.9) - off ARIMEDEX per DrRubin... she had an abn mammogram in 2007 w/ subseq right lumpectomy & sentinel node bx by DrStreck pos for invasive ductal cancer- stage I... post op XRT from DrWu, and followed by DrRubin ever since then...   DEGENERATIVE ARTHRITIS >> ~  XRay of right hip 9/07 showed marked degenerative changes w/ narrowing, sclerosis,  spur formation...  LOW BACK PAIN SYNDROME (ICD-724.2) - she had lumbar surgery in 1989 by DrAplington w/ recurrent back and leg pain in 2006 & was evaluated by DrAplington & DrNudelman- he rec conservative management as the required surgery would be quite extensive... she saw DrKirshmayer in the pain management cliic as well... ~  MRI Lumbar Spine 9/07 showed degenerative lumbar spondylosis w/ assoc DDD & degen facet arthritis; multilevel spinal, lat recess, & foraminal stenoses at various levels... ~  CXR reports wedge deformity of lower thoracic vert, she denies any acute pain...  OSTEOPENIA (ICD-733.90) -  DrRubin had her on ALENDRONATE weekly + caltrate & MVI> but this was stopped ?when ?why... ~  BMD at [facility] by DrRubin 7/07 showed osteopenia w/ TScores -1.5 in Spine, & -1.3 in right hip... ~  2/13: f/u BMD==> TScores -1.2 in Spine, and -2.9 in left [facility]; she is rec to restart the FOSAMAX 70mg  Qwk...  DEMENTIA >> on ARICEPT 10mg /d... ~  CT Brain 9/11 by the hospitalists showed generalized atrophy, chronic small vessel disease, NAD (no mets), chronic sinusitis... ~  CT Brain 1/12 by DrPlovsky showed some atrophy, small vessel ischemic disease, no acute infarcts; there was also evid of extensive chronic sinusitis ~  1/13: she seems better- brighter, more spontaneous, etc...  ANEMIA >> Fe-deficient anemia on FeSO4 325mg /d... ~  [facility] 9/11 w/ severe microcytic anemia ?etiology; Hg=5.3, MCV=60; Fe/Tibc weren't done; transfused ?units of ABpos blood & final Hg=11.4, MCV=71... ~  In the [facility] 9/11 her B12 level was 188 & she was given B12 shots w/o further eval; B12 level improved to 765 w/ the shots... ~  She saw DrLowne in the office 10/11 & was sched for B12 shots to start but she never returned... ~  Starting in 2/12 she was re-eval by [facility] w/ Hg=8.4, she no showed/ cancelled/ refused several attempts to sched EGD/ Colon & Hg dropped t 7.7 then 6.3; she was transfused as outpt then lost to  f/u until her 8/12 OV w/ here... ~  Labs 8/12 showed Hg=10.5, MCV=73, Fe=not done> we decided on FeSO4 325mg /d w/ Vit C 500mg /d  VITAMIN B12 Deficiency >> asked to take B12 OTC oral supplement ~  Labs  in hosp 9/11 showed Vit B12 level = 188; she was immed started on shots by the hospitalists; f/u B12 level 10/11 was 765 & DrLowne planned monthly shots but she never showed up... ~  Labs here 8/12 showed B12 level = 242 and we decided to supplement orally daily for now...   Past Surgical History  Procedure Date  . Appendectomy 1950;s  . Right inguinal hernia repair   . Lumbar laminectomy 1989    Dr, Simonne Come  . Laparoscopic cholecystectomy 1992    Dr. Orson Slick  . Right breast lumpectomy/sentinel node bx 06/2005    Dr. Jamey Ripa    Outpatient Encounter Prescriptions as of 08/28/2011  Medication Sig Dispense Refill  . ADVAIR DISKUS 500-50 MCG/DOSE AEPB INHALE 1 PUFF TWICE A DAY  60 each  0  . alendronate (FOSAMAX) 70 MG tablet Take 1 tablet (70 mg total) by mouth every 7 (seven) days. Take with a full glass of water on an empty stomach.  4 tablet  PRN  . Calcium Carbonate-Vitamin D (CALTRATE 600+D) 600-400 MG-UNIT per tablet Take 1 tablet by mouth 2 (two) times daily.        . cholecalciferol (VITAMIN D) 1000 UNITS tablet Take 1,000 Units by mouth daily.        Marland Kitchen donepezil (ARICEPT) 10 MG tablet Take 10 mg by mouth at bedtime.       . ferrous sulfate 325 (65 FE) MG tablet Take 325 mg by mouth daily with breakfast.      . metoprolol (LOPRESSOR) 50 MG tablet Take 1 tablet (50 mg total) by mouth 2 (two) times daily.  60 tablet  11  . omeprazole (PRILOSEC) 20 MG capsule Take 1 capsule (20 mg total) by mouth daily.  30 capsule  11    Allergies  Allergen Reactions  . Levofloxacin     REACTION: dizziness    Current Medications, Allergies, Past Medical History, Past Surgical History, Family History, and Social History were reviewed in [employer]  record.   Review of Systems       See HPI - all other systems neg except as noted...       The patient complains of some weakness, & dyspnea on exertion.  The patient denies anorexia, fever, weight loss, weight gain, vision loss, decreased hearing, hoarseness, chest pain, syncope, peripheral edema, prolonged cough, headaches, hemoptysis, abdominal pain, melena, hematochezia, severe indigestion/heartburn, hematuria, incontinence, muscle weakness, suspicious skin lesions, transient blindness, difficulty walking, depression, unusual weight change, abnormal bleeding, enlarged lymph nodes, and angioedema.   Objective:   Physical Exam    WD, sl thin & pale, 78 y/o WF in NAD... she is chronically ill appearing. GENERAL:  Alert & mostly cooperative, obvious dementia/ some confusion. HEENT:  [place]/AT, EOM-wnl, PERRLA, EACs-clear, TMs-wnl, NOSE- sl congested, THROAT-clear & wnl. NECK:  Supple w/ fairROM; no JVD; normal carotid impulses w/o bruits; no thyromegaly or nodules palpated; no lymphadenopathy. CHEST:  Clear to P & A; without wheezes or rales- few rhonchi scattered bilat... HEART:  Regular Rhythm; without murmurs/ rubs/ or gallops heard... ABDOMEN:  Soft & nontender; normal bowel sounds; no organomegaly or masses detected. EXT: without deformities, mild arthritic changes; no varicose veins/ +venous insuffic/ no edema. NEURO:  CN's intact; motor testing normal; sensory testing normal; gait normal & balance OK. DERM:  No lesions noted; no rash etc...  MMSE 10/02/10 >> Date 2012 Aug ?day; she didn't know the Pres/ VP/ Gov/ or prev administration; remembered 3/3 objects immed but 0/3  after distraction; serial 7's= 100-93-86-7?; WORLD backwards= DLROW; proverbs were mixed- one abstracted (glass houses), others concrete (stitch, etc)...   RADIOLOGY DATA:  Reviewed in the EPIC EMR & discussed w/ the patient & daughter...  LABORATORY DATA:  Reviewed in the EPIC EMR & discussed w/ the patient &  daughter...   Assessment & Plan:   Asthma> this has NOT been an issue but she has some bibasilar congestion & asked to add MUCINEX 1-2Bid w/ fluids...  HBP> BP controlled well on the Metoprolol 50mg Bid...  HH seen on CXR> may be source of GI blood loss & her iron defic anemia; rec OMEPRAZOLE 20mg /d for now...  IBS, Divertics w/ stricture in sigmoid>  See 2005 hosp for diverticulitis, then subseq work up by [facility] in 2006; states she is regular w/o difficulty.  BREAST CANCER>  Followed by Dennison Mascot, she has been off her Arimidex & I don't have any recent notes from him to review...  Osteopenia & ?compression fx ~L1 on CXR> prev on alendronate from DrRubin, f/u BMD 2/13 w/ TScore -2.9 in left FemNeck & Alendronate restarted...  Anemia> ?etiology (never determined w/ certainty) but most likely her mod HH seen on CXR (prev EGD/ Colon 2006 showed smHH, divertics w/ stricture, no lesions seen); Hg now 12.5, MCV=85==> continue the Fe daily for now...  Vit B12 defic> it was 188 in hosp 9/11 & started on B12 shots immed by the hospitalists; f/u level 765 shortly after disch but she never ret for continued shots; B12 level now 242 & we discussed trial of oral supplementation taking po daily...  Dementia> she is seeing DrPlovsky for Psyche> on Aricept 10mg /d...   Patient's Medications  New Prescriptions   No medications on file  Previous Medications   ADVAIR DISKUS 500-50 MCG/DOSE AEPB    INHALE 1 PUFF TWICE A DAY   ALENDRONATE (FOSAMAX) 70 MG TABLET    Take 1 tablet (70 mg total) by mouth every 7 (seven) days. Take with a full glass of water on an empty stomach.   CALCIUM CARBONATE-VITAMIN D (CALTRATE 600+D) 600-400 MG-UNIT PER TABLET    Take 1 tablet by mouth 2 (two) times daily.     CHOLECALCIFEROL (VITAMIN D) 1000 UNITS TABLET    Take 1,000 Units by mouth daily.     DONEPEZIL (ARICEPT) 10 MG TABLET    Take 10 mg by mouth at bedtime.    FERROUS SULFATE 325 (65 FE) MG TABLET    Take  325 mg by mouth daily with breakfast.   METOPROLOL (LOPRESSOR) 50 MG TABLET    Take 1 tablet (50 mg total) by mouth 2 (two) times daily.   OMEPRAZOLE (PRILOSEC) 20 MG CAPSULE    Take 1 capsule (20 mg total) by mouth daily.  Modified Medications   No medications on file  Discontinued Medications   No medications on file  She is asked to restart Calcium, Women's MVI, Vit D 1000u daily & B-12 108mcg/d oral supplements.Marland KitchenMarland Kitchen

## 2011-10-19 ENCOUNTER — Other Ambulatory Visit: Payer: Self-pay | Admitting: Pulmonary Disease

## 2011-10-20 ENCOUNTER — Other Ambulatory Visit: Payer: Self-pay | Admitting: Pulmonary Disease

## 2011-10-21 ENCOUNTER — Other Ambulatory Visit: Payer: Self-pay | Admitting: Pulmonary Disease

## 2011-11-11 ENCOUNTER — Other Ambulatory Visit: Payer: Self-pay | Admitting: Pulmonary Disease

## 2012-02-11 DIAGNOSIS — D649 Anemia, unspecified: Secondary | ICD-10-CM

## 2012-02-11 HISTORY — DX: Anemia, unspecified: D64.9

## 2012-02-22 ENCOUNTER — Other Ambulatory Visit: Payer: Self-pay | Admitting: Pulmonary Disease

## 2012-03-02 ENCOUNTER — Encounter (HOSPITAL_COMMUNITY): Payer: Self-pay | Admitting: *Deleted

## 2012-03-02 ENCOUNTER — Inpatient Hospital Stay (HOSPITAL_COMMUNITY)
Admission: EM | Admit: 2012-03-02 | Discharge: 2012-03-07 | DRG: 812 | Disposition: A | Discharge status: Home / Self Care | Payer: Medicare Other | Attending: Internal Medicine | Admitting: Internal Medicine

## 2012-03-02 ENCOUNTER — Ambulatory Visit (INDEPENDENT_AMBULATORY_CARE_PROVIDER_SITE_OTHER): Payer: Medicare Other | Admitting: Pulmonary Disease

## 2012-03-02 ENCOUNTER — Inpatient Hospital Stay (HOSPITAL_COMMUNITY): Payer: Medicare Other

## 2012-03-02 ENCOUNTER — Encounter: Payer: Self-pay | Admitting: Pulmonary Disease

## 2012-03-02 VITALS — BP 100/60 | HR 68 | Temp 97.0°F | Ht 66.0 in | Wt 122.0 lb

## 2012-03-02 DIAGNOSIS — I872 Venous insufficiency (chronic) (peripheral): Secondary | ICD-10-CM | POA: Diagnosis present

## 2012-03-02 DIAGNOSIS — M949 Disorder of cartilage, unspecified: Secondary | ICD-10-CM

## 2012-03-02 DIAGNOSIS — J45909 Unspecified asthma, uncomplicated: Secondary | ICD-10-CM | POA: Diagnosis present

## 2012-03-02 DIAGNOSIS — F0390 Unspecified dementia without behavioral disturbance: Secondary | ICD-10-CM | POA: Diagnosis present

## 2012-03-02 DIAGNOSIS — M545 Low back pain: Secondary | ICD-10-CM

## 2012-03-02 DIAGNOSIS — D508 Other iron deficiency anemias: Secondary | ICD-10-CM | POA: Diagnosis present

## 2012-03-02 DIAGNOSIS — D518 Other vitamin B12 deficiency anemias: Secondary | ICD-10-CM

## 2012-03-02 DIAGNOSIS — M1990 Unspecified osteoarthritis, unspecified site: Secondary | ICD-10-CM

## 2012-03-02 DIAGNOSIS — D509 Iron deficiency anemia, unspecified: Principal | ICD-10-CM | POA: Diagnosis present

## 2012-03-02 DIAGNOSIS — K5732 Diverticulitis of large intestine without perforation or abscess without bleeding: Secondary | ICD-10-CM | POA: Diagnosis present

## 2012-03-02 DIAGNOSIS — Z79899 Other long term (current) drug therapy: Secondary | ICD-10-CM

## 2012-03-02 DIAGNOSIS — F068 Other specified mental disorders due to known physiological condition: Secondary | ICD-10-CM

## 2012-03-02 DIAGNOSIS — C50919 Malignant neoplasm of unspecified site of unspecified female breast: Secondary | ICD-10-CM

## 2012-03-02 DIAGNOSIS — K589 Irritable bowel syndrome without diarrhea: Secondary | ICD-10-CM | POA: Diagnosis present

## 2012-03-02 DIAGNOSIS — M810 Age-related osteoporosis without current pathological fracture: Secondary | ICD-10-CM | POA: Diagnosis present

## 2012-03-02 DIAGNOSIS — K5730 Diverticulosis of large intestine without perforation or abscess without bleeding: Secondary | ICD-10-CM

## 2012-03-02 DIAGNOSIS — M5030 Other cervical disc degeneration, unspecified cervical region: Secondary | ICD-10-CM | POA: Diagnosis present

## 2012-03-02 DIAGNOSIS — I10 Essential (primary) hypertension: Secondary | ICD-10-CM | POA: Diagnosis present

## 2012-03-02 DIAGNOSIS — Z853 Personal history of malignant neoplasm of breast: Secondary | ICD-10-CM

## 2012-03-02 DIAGNOSIS — M899 Disorder of bone, unspecified: Secondary | ICD-10-CM | POA: Diagnosis present

## 2012-03-02 DIAGNOSIS — J309 Allergic rhinitis, unspecified: Secondary | ICD-10-CM

## 2012-03-02 DIAGNOSIS — K449 Diaphragmatic hernia without obstruction or gangrene: Secondary | ICD-10-CM | POA: Diagnosis present

## 2012-03-02 DIAGNOSIS — K219 Gastro-esophageal reflux disease without esophagitis: Secondary | ICD-10-CM | POA: Diagnosis present

## 2012-03-02 HISTORY — DX: Unspecified dementia, unspecified severity, without behavioral disturbance, psychotic disturbance, mood disturbance, and anxiety: F03.90

## 2012-03-02 HISTORY — DX: Other cervical disc degeneration, unspecified cervical region: M50.30

## 2012-03-02 HISTORY — DX: Anemia, unspecified: D64.9

## 2012-03-02 LAB — CBC WITH DIFFERENTIAL/PLATELET
Basophils Absolute: 0.2 10*3/uL — ABNORMAL HIGH (ref 0.0–0.1)
Eosinophils Absolute: 0.2 10*3/uL (ref 0.0–0.7)
Eosinophils Relative: 2 % (ref 0–5)
Lymphs Abs: 1.3 10*3/uL (ref 0.7–4.0)
MCH: 14 pg — ABNORMAL LOW (ref 26.0–34.0)
MCHC: 25.5 g/dL — ABNORMAL LOW (ref 30.0–36.0)
MCV: 55.2 fL — ABNORMAL LOW (ref 78.0–100.0)
Monocytes Absolute: 1 10*3/uL (ref 0.1–1.0)
Neutrophils Relative %: 73 % (ref 43–77)
Platelets: 515 10*3/uL — ABNORMAL HIGH (ref 150–400)
RDW: 20.1 % — ABNORMAL HIGH (ref 11.5–15.5)

## 2012-03-02 LAB — VITAMIN B12: Vitamin B-12: 628 pg/mL (ref 211–911)

## 2012-03-02 LAB — BASIC METABOLIC PANEL
BUN: 6 mg/dL (ref 6–23)
CO2: 21 mEq/L (ref 19–32)
Calcium: 8.5 mg/dL (ref 8.4–10.5)
Creatinine, Ser: 0.67 mg/dL (ref 0.50–1.10)
GFR calc non Af Amer: 83 mL/min — ABNORMAL LOW (ref >90)
Glucose, Bld: 102 mg/dL — ABNORMAL HIGH (ref 70–99)
Sodium: 135 mEq/L (ref 135–145)

## 2012-03-02 LAB — IRON AND TIBC

## 2012-03-02 LAB — HEMOGLOBIN AND HEMATOCRIT, BLOOD
HCT: 16 % — ABNORMAL LOW (ref 36.0–46.0)
Hemoglobin: 7.1 g/dL — ABNORMAL LOW (ref 12.0–15.0)

## 2012-03-02 LAB — TROPONIN I: Troponin I: <0.3 ng/mL (ref <0.30)

## 2012-03-02 LAB — FOLATE: Folate: 8.2 ng/mL

## 2012-03-02 MED ORDER — ALBUTEROL SULFATE (5 MG/ML) 0.5% IN NEBU: 2.5000 mg | INHALATION_SOLUTION | RESPIRATORY_TRACT | Status: DC | PRN

## 2012-03-02 MED ORDER — ONDANSETRON HCL 4 MG/2ML IJ SOLN: 4.0000 mg | Freq: Four times a day (QID) | INTRAMUSCULAR | Status: DC | PRN

## 2012-03-02 MED ORDER — MOMETASONE FURO-FORMOTEROL FUM 200-5 MCG/ACT IN AERO
2.0000 | INHALATION_SPRAY | Freq: Two times a day (BID) | RESPIRATORY_TRACT | Status: DC
  Administered 2012-03-03 – 2012-03-07 (×8): 2 via RESPIRATORY_TRACT
  Filled 2012-03-02: qty 8.8

## 2012-03-02 MED ORDER — MAGNESIUM SULFATE IN D5W 10-5 MG/ML-% IV SOLN
1.0000 g | INTRAVENOUS | Status: AC
  Administered 2012-03-02: 1 g via INTRAVENOUS
  Filled 2012-03-02: qty 100

## 2012-03-02 MED ORDER — VITAMIN B-12 1000 MCG PO TABS
1000.0000 ug | ORAL_TABLET | Freq: Every day | ORAL | Status: DC
  Administered 2012-03-03 – 2012-03-07 (×6): 1000 ug via ORAL
  Filled 2012-03-02 (×6): qty 1

## 2012-03-02 MED ORDER — GUAIFENESIN-DM 100-10 MG/5ML PO SYRP
5.0000 mL | ORAL_SOLUTION | ORAL | Status: DC | PRN
  Filled 2012-03-02: qty 5

## 2012-03-02 MED ORDER — VITAMIN D3 25 MCG (1000 UNIT) PO TABS
1000.0000 [IU] | ORAL_TABLET | Freq: Every day | ORAL | Status: DC
  Administered 2012-03-03 – 2012-03-07 (×5): 1000 [IU] via ORAL
  Filled 2012-03-02 (×6): qty 1

## 2012-03-02 MED ORDER — HYDROCODONE-ACETAMINOPHEN 5-325 MG PO TABS
1.0000 | ORAL_TABLET | ORAL | Status: DC | PRN
  Administered 2012-03-03: 1 via ORAL
  Administered 2012-03-03: 2 via ORAL
  Administered 2012-03-06: 1 via ORAL
  Filled 2012-03-02 (×2): qty 1
  Filled 2012-03-02: qty 2

## 2012-03-02 MED ORDER — PANTOPRAZOLE SODIUM 40 MG IV SOLR
40.0000 mg | Freq: Two times a day (BID) | INTRAVENOUS | Status: DC
  Administered 2012-03-03 – 2012-03-05 (×7): 40 mg via INTRAVENOUS
  Filled 2012-03-02 (×10): qty 40

## 2012-03-02 MED ORDER — POTASSIUM CHLORIDE IN NACL 20-0.9 MEQ/L-% IV SOLN
INTRAVENOUS | Status: AC
  Administered 2012-03-03 (×2): via INTRAVENOUS
  Filled 2012-03-02 (×2): qty 1000

## 2012-03-02 MED ORDER — CALCIUM CARBONATE-VITAMIN D 500-200 MG-UNIT PO TABS
1.0000 | ORAL_TABLET | Freq: Two times a day (BID) | ORAL | Status: DC
  Administered 2012-03-03 – 2012-03-07 (×9): 1 via ORAL
  Filled 2012-03-02 (×11): qty 1

## 2012-03-02 MED ORDER — SODIUM CHLORIDE 0.9 % IV BOLUS (SEPSIS)
1000.0000 mL | Freq: Once | INTRAVENOUS | Status: AC
  Administered 2012-03-02: 1000 mL via INTRAVENOUS

## 2012-03-02 MED ORDER — POTASSIUM CHLORIDE CRYS ER 20 MEQ PO TBCR
40.0000 meq | EXTENDED_RELEASE_TABLET | Freq: Once | ORAL | Status: AC
  Administered 2012-03-02: 40 meq via ORAL
  Filled 2012-03-02: qty 2

## 2012-03-02 MED ORDER — DONEPEZIL HCL 10 MG PO TABS
10.0000 mg | ORAL_TABLET | Freq: Every day | ORAL | Status: DC
  Administered 2012-03-03 – 2012-03-06 (×5): 10 mg via ORAL
  Filled 2012-03-02 (×6): qty 1

## 2012-03-02 MED ORDER — ONDANSETRON HCL 4 MG PO TABS: 4.0000 mg | ORAL_TABLET | Freq: Four times a day (QID) | ORAL | Status: DC | PRN

## 2012-03-02 MED ORDER — SODIUM CHLORIDE 0.9 % IJ SOLN
3.0000 mL | Freq: Two times a day (BID) | INTRAMUSCULAR | Status: DC
  Administered 2012-03-03 – 2012-03-07 (×6): 3 mL via INTRAVENOUS

## 2012-03-02 MED ORDER — FERROUS SULFATE 325 (65 FE) MG PO TABS
325.0000 mg | ORAL_TABLET | Freq: Every day | ORAL | Status: DC
  Administered 2012-03-03 – 2012-03-07 (×5): 325 mg via ORAL
  Filled 2012-03-02 (×6): qty 1

## 2012-03-02 MED ORDER — METOPROLOL TARTRATE 50 MG PO TABS
50.0000 mg | ORAL_TABLET | Freq: Two times a day (BID) | ORAL | Status: DC
  Administered 2012-03-03 – 2012-03-07 (×9): 50 mg via ORAL
  Filled 2012-03-02 (×11): qty 1

## 2012-03-02 NOTE — ED Notes (Signed)
[name] daughter, legal guardian. [phone] cell.

## 2012-03-02 NOTE — ED Provider Notes (Addendum)
History     CSN: [id number]  Arrival date & time 03/02/12  1322   First MD Initiated Contact with Patient 03/02/12 1328      Chief Complaint  Patient presents with  . GI Bleeding    (Consider location/radiation/quality/duration/timing/severity/associated sxs/prior treatment) HPI Comments: Pt comes in with cc of anemia. Pt has hx of diverticular dz per EGD in 2006. Pt went for routine visit to Dr. Jodelle Green office, he found her to be pale appearing, and requested an ED transfer. Pt denies   The history is provided by the patient.    Past Medical History  Diagnosis Date  . Allergic rhinitis   . Asthma   . Hypertension   . Chronic rhinitis   . Venous insufficiency   . Hiatal hernia   . IBS (irritable bowel syndrome)   . Diverticulosis of colon   . History of breast cancer   . Low back pain syndrome   . Osteopenia   . Fatigue   . Headache   . Anxiety   . Dementia   . DDD (degenerative disc disease), cervical     Past Surgical History  Procedure Date  . Appendectomy 1950;s  . Right inguinal hernia repair   . Lumbar laminectomy 1989    Dr, Simonne Come  . Laparoscopic cholecystectomy 1992    Dr. Orson Slick  . Right breast lumpectomy/sentinel node bx 06/2005    Dr. Jamey Ripa    No family history on file.  History  Substance Use Topics  . Smoking status: Former Smoker -- 1.0 packs/day for 32 years    Types: Cigarettes    Quit date: 02/10/1985  . Smokeless tobacco: Not on file  . Alcohol Use: No    OB History    Grav Para Term Preterm Abortions TAB SAB Ect Mult Living                  Review of Systems  Unable to perform ROS: Dementia    Allergies  Levofloxacin  Home Medications   Current Outpatient Rx  Name  Route  Sig  Dispense  Refill  . ADVAIR DISKUS 500-50 MCG/DOSE IN AEPB      USE 1 INHALATION EVERY 12 HOURS   60 each   5   . ALENDRONATE SODIUM 70 MG PO TABS   Oral   Take 1 tablet (70 mg total) by mouth every 7 (seven) days. Take with a full  glass of water on an empty stomach.   4 tablet   PRN   . CALCIUM CARBONATE-VITAMIN D 600-400 MG-UNIT PO TABS   Oral   Take 1 tablet by mouth 2 (two) times daily.           Marland Kitchen VITAMIN D 1000 UNITS PO TABS   Oral   Take 1,000 Units by mouth daily.           . DONEPEZIL HCL 10 MG PO TABS   Oral   Take 10 mg by mouth at bedtime.          Marland Kitchen FERROUS SULFATE 325 (65 FE) MG PO TABS   Oral   Take 325 mg by mouth daily with breakfast.         . METOPROLOL TARTRATE 50 MG PO TABS   Oral   Take 1 tablet (50 mg total) by mouth 2 (two) times daily.   60 tablet   11   . OMEPRAZOLE 20 MG PO CPDR      TAKE ONE CAPSULE BY MOUTH  EVERY DAY   30 capsule   2   . VITAMIN B-12 1000 MCG PO TABS   Oral   Take 1,000 mcg by mouth daily.           BP 143/57  Pulse 72  Temp 97.6 F (36.4 C) (Oral)  Resp 16  SpO2 99%  Physical Exam  Nursing note and vitals reviewed. Constitutional: She appears well-developed and well-nourished.  HENT:  Head: Normocephalic and atraumatic.  Eyes: EOM are normal. Pupils are equal, round, and reactive to light.  Neck: Neck supple.  Cardiovascular: Normal rate, regular rhythm and normal heart sounds.   No murmur heard. Pulmonary/Chest: Effort normal. No respiratory distress.  Abdominal: Soft. She exhibits no distension. There is no tenderness. There is no rebound and no guarding.       No melena  Neurological: She is alert.  Skin: Skin is warm and dry.    ED Course  Procedures (including critical care time)  Labs Reviewed  BASIC METABOLIC PANEL - Abnormal; Notable for the following:    Potassium 3.2 (*)     Glucose, Bld 102 (*)     GFR calc non Af Amer 83 (*)     All other components within normal limits  CBC WITH DIFFERENTIAL - Abnormal; Notable for the following:    RBC 2.99 (*)     Hemoglobin 4.2 (*)     HCT 16.5 (*)     MCV 55.2 (*)     MCH 14.0 (*)     MCHC 25.5 (*)     RDW 20.1 (*)     Platelets 515 (*)     Basophils Relative  2 (*)     Basophils Absolute 0.2 (*)     All other components within normal limits  TYPE AND SCREEN  PROTIME-INR  TROPONIN I  APTT  PREPARE RBC (CROSSMATCH)  VITAMIN B12  FOLATE  IRON AND TIBC  FERRITIN  RETICULOCYTES  HEMOGLOBIN AND HEMATOCRIT, BLOOD  HEMOGLOBIN AND HEMATOCRIT, BLOOD   No results found.   1. DJD (degenerative joint disease)   2. Allergic rhinitis, cause unspecified   3. Diaphragmatic hernia without mention of obstruction or gangrene   4. Disorder of bone and cartilage, unspecified   5. Diverticulosis of colon (without mention of hemorrhage)       MDM  DDx includes: Esophagitis Mallory Weiss tear [place]  Variceal bleeding PUD/Gastritis/ulcers Diverticular bleed Colon cancer Rectal bleed Internal hemorrhoids External hemorrhoids  Pt comes in with cc of anemia. She has no melena, no hematochezia. Pt's Hb is 4. Will start transfusion. Likely diverticular process - and slow bleeding process, since she appears well decompensated hemodynamically.   CRITICAL CARE Performed by: Derwood Kaplan   Total critical care time: 45 minutes  Critical care time was exclusive of separately billable procedures and treating other patients.  Critical care was necessary to treat or prevent imminent or life-threatening deterioration.  Critical care was time spent personally by me on the following activities: development of treatment plan with patient and/or surrogate as well as nursing, discussions with consultants, evaluation of patient's response to treatment, examination of patient, obtaining history from patient or surrogate, ordering and performing treatments and interventions, ordering and review of laboratory studies, ordering and review of radiographic studies, pulse oximetry and re-evaluation of patient's condition.    Derwood Kaplan, MD 03/02/12 1621  Derwood Kaplan, MD 03/02/12 1622  4:22 PM Dr. Madilyn Fireman aware of the patient. He will see the  patient as a Research scientist (medical).  [facility] to admit.  Derwood Kaplan, MD 03/02/12 1622

## 2012-03-02 NOTE — Progress Notes (Signed)
Subjective:    Patient ID: [name], female    DOB: 05-19-1933, 79 y.o.   MRN: [id number]  HPI 79 y/o WF here to re-establish care after a >9yr hiatus... she has multiple medical problems as noted below & she also sees DrJHayes for GI, DrRKaplan for GYN, and DrRubin for Breast Cancer.  ~  October 02, 2010:  >42yr ROV & brought in by her daughter [name] (work# [phone], cell# [phone]); there is much interval history gleaned from daughter, [employer] EMR records, [employer] records & TC w/ DrJHayes>    9/11:  Family took her to ER w/ weakness, nausea, FTT at home> eval revealed severe microcytic anemia w/ Hg=5.3, MCV=60, Fe/Tibc weren't done; she was transfused & GI consulted (DrGanem, DrJHayes); stool hemoccult was NEG, PT/INR & LFTs were normal; pt refused EGD/ Colon & was obviously demented & difficult to deal w/ in the Hosp> Psychiatry was called & she was deemed incompetent, [name] declared her Guardian by the Court;  She was stabilized & disch home on Metoprolol, Protonix, Femara, Xanax, B12 tabs...    10/11:  She was seen post hosp by DrLowne just one time> Hg=11.6, MCV=74, Fe=30, TIBC=309, B12=765, VitD=23; she was given a Vit B12 shot & a PNEUMOVAX injection and asked to ret for monthly B12 shots but she never returned...    2/12:  She went to see DrJHayes w/ Hg reported 8.4 & she was relatively asymptomatic; started on Iron, stools negx6; he recommended repeat EGD/ Colon, had it sched several times but she repeatedly cancelled/ refused/ etc; her Hg dropped to 7.7, then 6.3, had 2 of 6 repeat stool cards pos for occult blood, & she agreed to transfusion- done as outpt 6/12...    8/12:  Now daughter brings her back in to see me & I spent several hours piecing together her story >>    Asthma> this has NOT been an issue but oddly enough the Advair is one of the few meds she will take regularly w/o being prompted...    HBP> controlled well on the Metoprolol 50mg Bid.    HH seen on CXR> may  be source of GI blood loss & her iron defic anemia; rec OMEPRAZOLE 20mg /d for now...    Osteopenia & ?compression fx ~L1 on CXR> we will look into this later, for now rec continuing Calcium, MVI, Vit D 1000/d...    Anemia> ?etiology (never determined w/ certainty) but most likely her mod HH seen on CXR (prev EGD/ Colon 2006 showed smHH, divertics w/ stricture, no lesions seen); Hg now 10.5, MCV=73, Fe not done; we will continue RX w/ FESO4 325mg /d & take w/ Vit C 500mg /d; plan careful f/u Hg & could consider Fe infusion if nec...    Vit B12 defic> it was 188 in hosp 9/11 & started on B12 shots immed by the hospitalists; f/u level 765 shortly after disch but she never ret for continued shots; B12 level now 242 & we discussed trial of oral supplementation taking po daily...    Dementia> she is seeing DrPlovsky for Psyche> on Aricept 10mg /d...  ~  November 04, 2010:  39mo ROV & she is feeling sl better just c/o some allergy symptoms; she is getting some exercise walking "[name]" her Cocker spaniel up the hill & back she says...  Breathing is stable on her Advair & Mucinex;  BP is acceptable on Metop50Bid;  She denies GI symptoms on her Omep & stools dark from the Fe;  She continues  to see DrPlovsky on the Aricept;  She notes fair appetite & wt is down 10# to 133# today & asked to start dietary supplements...  We decided to continue same meds and recheck in 42mo w/ f/u labs...  ~  March 04, 2011:  49mo ROV & Micole visited the [facility] w/ n/v, palpit, weakness> review of record showed rales in chest, neg abd exam, Wbc=13.7, Hg=12.5, CXR w/ ?LLLpneumonia, CTBrain w/ atrophy/ sm vessel dis/ NAD;  She was treated w/ IVfluids, & given ZPak & Amox at disch, slowly improved, back to baseline...    Today she seems much improved, more jovial & spontaneous, states she is doing well, & daughter confirms, wt stable 132# w/ Ensure supplements...    Asthma/ ?COPD> on Advair500Bid; improved after episode  above 7 back to baseline...    HBP> controlled on Metop50Bid; BP= 142/68 today & daugh monitors her meds at home...    HH> on Prilosec20/d; prev n/v resolved, denies choking episode/ trouble swallowing/ etc...    Divertics/IBS> she has known divertics & narrowing in sigmoid area; reminded to stay on Miralax/ Senakot-S/ etc...    Hx Breast Cancer> followed by DrRubin...    DJD, LBP> known degen lumbar spondylosis etc; part compression fx seen in lower thor vert on CXR...    Osteopenia> due for f/u BMD & consideration of Bisphos therapy again; reminded to take Calcium, MVI, VitD...    Dementia> followed by DrPlovsky on Aricept10 & she has definitely stabilized/ improved...    Anemia> etiology never fully determined but likely related to large HH; on FeSO4 daily  ~  August 28, 2011:  22mo ROV & Chrystie is basically stable, c/o some back discomfort today, no known trauma etc & we rec Rest, Heat, Tylenol, Advil, etc... She had BMD rechecked 2/13 w/ TScore -2.9 in left FemNeck & FOSAMAX 70mg /wk restarted... She is eating better & weight up 5# to 137# today...    We reviewed prob list, meds, xrays and labs> see below for updates >>  ~  March 02, 2012:  22mo ROV & [name] is incredibly pale and dyspneic w/ min activity; brought in by her daughter who says she didn't notice color change but mom has been c/o nausea & not eating well;  Pt denies abd pain or any pain really, she is nauseated but no vomiting; pt states that BMs are normal consistency, brown (not black) & no red blood seen;  Chart indicates 14# wt loss in 22mo to 122# today (daugh states she's not eating);  Hemodynamically stable- BP 100/60 sitting, pulse= 70 regular, RR= 20 & not labored at rest but incr dyspnea walking to bathroom...  Last labs in [facility] showed Hg=12.5 ~1 year ago...  DrJHayes, [facility] is her gastroenterologist- SEE BELOW for details: she had EGD & Colon/BE in 2006 and she refused f/u studies during a similar presentation in 2011    We  reviewed prob list, meds, xrays and labs>> I discussed case w/ ED physician & we will send to [facility] ER for admission by [facility]...          Current Problems:   << ALLERGIC reaction to CIPRO in the past w/ HIVES >>  ALLERGIC RHINITIS (ICD-477.9) - prev allergy eval yrs ago by DrESL & on shots for awhile. CHRONIC RHINITIS (ICD-472.0) - prev ENT eval 2005 by DrRosen for recurrent sinus infections, pressure in her head & HA's... he wanted to do Sinus CT but she declined... Rx w/ antihist/ saline/ Nasonex...  ASTHMA (  ICD-493.90) - controlled on ADVAIR500 Bid; she no longer has a rescue inhaler; denies breathing difficulty. Cough, sputum, hemoptysis, etc... ~  CXR 5/07 (pre-op for breast surg) showed mild chronic changes, scarring left base, NAD.Marland Kitchen. ~  CXR 8/12 showed mod HH seen behind the heart, mild scarring vs atx left base, wedging of lower TSpine vert body... ~  12/12: went to ER w/ ?n/v/weakness; CXR- ?LLLinfiltrate vs scarring/atx; improved w/ ZPak & Amox...  HYPERTENSION (ICD-401.9) - controlled on LOPRESSOR 50mg  Bid... she was seen by [facility] in the 1990's... ~  2DEcho 4/91 showed mild MVP w/ myxomatous ant leaflet & mild MR... ~  Myocardial Perfusion Scan 5/95 was WNL showing normal contractility & uptake in all myocardial segments. ~  1/13:  BP= 142/68 & she states asymptomatic w/o CP, palpit, SOB, edema, etc... ~  7/13:  BP= 126/64 & doing well overall she says...  SUPRAVENTRICULAR TACHYCARDIA >> she developed a transient SVT 9/11 during her Hosp for anemia; subseq EKG showed NSR w/ PACs & minor NSSTTWA.  VENOUS INSUFFICIENCY (ICD-459.81) - she knows to avoid sodium, elevate legs, wear support hose when able...  HIATAL HERNIA (ICD-553.3) - she uses PRILOSEC 20mg /d...  ~  EGD 11/06 by DrJHayes showed sm HH, otherw normal, no evid of Barrett's... ~  CXR 8/12 showed ?mod sized HH behind the heart; ?source of her iron defic anemia? (only 2/18 stool cards pos for occult blood over the past  yr).  IRRITABLE BOWEL SYNDROME (ICD-564.1) DIVERTICULOSIS OF COLON (ICD-562.10) - reminded to use Miralax/ Senakot-S as needed... ~  11/05: Hosp w/ diverticulitis & abscess> Rx'd w/ antibiotics & improved; DrStreck considered sigmoid colectomy but she improved w/ high fiber diet etc... ~  Colonoscopy 11/06 by [facility] showed divertic stricture in the rectosigmoid junction, scope passed only to the mid transverse colon...  ~  11/06: subseq BE showed innumerable divertics, some narrowing in the sigmoid region, otherw neg...  Hx of BREAST CANCER (ICD-174.9) - off ARIMEDEX per DrRubin... she had an abn mammogram in 2007 w/ subseq right lumpectomy & sentinel node bx by DrStreck pos for invasive ductal cancer- stage I... post op XRT from DrWu, and followed by DrRubin ever since then...   DEGENERATIVE ARTHRITIS >> ~  XRay of right hip 9/07 showed marked degenerative changes w/ narrowing, sclerosis, spur formation...  LOW BACK PAIN SYNDROME (ICD-724.2) - she had lumbar surgery in 1989 by DrAplington w/ recurrent back and leg pain in 2006 & was evaluated by DrAplington & DrNudelman- he rec conservative management as the required surgery would be quite extensive... she saw DrKirshmayer in the pain management cliic as well... ~  MRI Lumbar Spine 9/07 showed degenerative lumbar spondylosis w/ assoc DDD & degen facet arthritis; multilevel spinal, lat recess, & foraminal stenoses at various levels... ~  CXR reports wedge deformity of lower thoracic vert, she denies any acute pain...  OSTEOPENIA (ICD-733.90) -  DrRubin had her on ALENDRONATE weekly + caltrate & MVI> but this was stopped ?when ?why... ~  BMD at [facility] by DrRubin 7/07 showed osteopenia w/ TScores -1.5 in Spine, & -1.3 in right hip... ~  2/13: f/u BMD==> TScores -1.2 in Spine, and -2.9 in left [facility] Ii; she is rec to restart the FOSAMAX 70mg  Qwk...  DEMENTIA >> on ARICEPT 10mg /d... ~  CT Brain 9/11 by the hospitalists showed generalized atrophy,  chronic small vessel disease, NAD (no mets), chronic sinusitis... ~  CT Brain 1/12 by [facility] showed some atrophy, small vessel ischemic disease, no acute infarcts; there was  also evid of extensive chronic sinusitis ~  1/13: she seems better- brighter, more spontaneous, etc...  ANEMIA >> Fe-deficient anemia on FeSO4 325mg /d... ~  [facility] 9/11 w/ severe microcytic anemia ?etiology; Hg=5.3, MCV=60; Fe/Tibc weren't done; transfused ?units of ABpos blood & final Hg=11.4, MCV=71... ~  In the [facility] 9/11 her B12 level was 188 & she was given B12 shots w/o further eval; B12 level improved to 765 w/ the shots... ~  She saw DrLowne in the office 10/11 & was sched for B12 shots to start but she never returned... ~  Starting in 2/12 she was re-eval by [facility] w/ Hg=8.4, she no showed/ cancelled/ refused several attempts to sched EGD/ Colon & Hg dropped t 7.7 then 6.3; she was transfused as outpt then lost to f/u until her 8/12 OV w/ here... ~  Labs 8/12 showed Hg=10.5, MCV=73, Fe=not done> we decided on FeSO4 325mg /d w/ Vit C 500mg /d ~  Labs 12/12 showed Hg= 12.5, MCV=85, & she remains on Fe daily...  VITAMIN B12 Deficiency >> asked to take B12 OTC oral supplement ~  Labs in hosp 9/11 showed Vit B12 level = 188; she was immed started on shots by the hospitalists; f/u B12 level 10/11 was 765 & DrLowne planned monthly shots but she never showed up... ~  Labs here 8/12 showed B12 level = 242 and we decided to supplement orally daily for now...   Past Surgical History  Procedure Date  . Appendectomy 1950;s  . Right inguinal hernia repair   . Lumbar laminectomy 1989    Dr, Simonne Come  . Laparoscopic cholecystectomy 1992    Dr. Orson Slick  . Right breast lumpectomy/sentinel node bx 06/2005    Dr. Jamey Ripa    Outpatient Encounter Prescriptions as of 03/02/2012  Medication Sig Dispense Refill  . ADVAIR DISKUS 500-50 MCG/DOSE AEPB USE 1 INHALATION EVERY 12 HOURS  60 each  5  . alendronate (FOSAMAX)  70 MG tablet Take 1 tablet (70 mg total) by mouth every 7 (seven) days. Take with a full glass of water on an empty stomach.  4 tablet  PRN  . Calcium Carbonate-Vitamin D (CALTRATE 600+D) 600-400 MG-UNIT per tablet Take 1 tablet by mouth 2 (two) times daily.        . cholecalciferol (VITAMIN D) 1000 UNITS tablet Take 1,000 Units by mouth daily.        Marland Kitchen donepezil (ARICEPT) 10 MG tablet Take 10 mg by mouth at bedtime.       . ferrous sulfate 325 (65 FE) MG tablet Take 325 mg by mouth daily with breakfast.      . metoprolol (LOPRESSOR) 50 MG tablet Take 1 tablet (50 mg total) by mouth 2 (two) times daily.  60 tablet  11  . omeprazole (PRILOSEC) 20 MG capsule TAKE ONE CAPSULE BY MOUTH EVERY DAY  30 capsule  2  . [DISCONTINUED] ADVAIR DISKUS 500-50 MCG/DOSE AEPB INHALE 1 PUFF TWICE A DAY  60 each  0  . [DISCONTINUED] ADVAIR DISKUS 500-50 MCG/DOSE AEPB USE 1 INHALATION EVERY 12 HOURS  60 each  5  . [DISCONTINUED] ADVAIR DISKUS 500-50 MCG/DOSE AEPB USE 1 INHALATION EVERY 12 HOURS  60 each  6  . [DISCONTINUED] metoprolol (LOPRESSOR) 50 MG tablet TAKE 1 TABLET BY MOUTH TWICE A DAY  60 tablet  10    Allergies  Allergen Reactions  . Levofloxacin     REACTION: dizziness    Current Medications, Allergies, Past Medical History, Past Surgical History, Family History,  and Social History were reviewed in [employer] record.   Review of Systems       See HPI - all other systems neg except as noted...       The patient complains of some weakness, & dyspnea on exertion.  The patient denies anorexia, fever, weight loss, weight gain, vision loss, decreased hearing, hoarseness, chest pain, syncope, peripheral edema, prolonged cough, headaches, hemoptysis, abdominal pain, melena, hematochezia, severe indigestion/heartburn, hematuria, incontinence, muscle weakness, suspicious skin lesions, transient blindness, difficulty walking, depression, unusual weight change, abnormal bleeding, enlarged  lymph nodes, and angioedema.   Objective:   Physical Exam    WD, sl thin & pale, 79 y/o WF in NAD... she is chronically ill appearing. GENERAL:  Alert & mostly cooperative, obvious dementia/ some confusion. HEENT:  [place]/AT, EOM-wnl, PERRLA, EACs-clear, TMs-wnl, NOSE- sl congested, THROAT-clear & wnl. NECK:  Supple w/ fairROM; no JVD; normal carotid impulses w/o bruits; no thyromegaly or nodules palpated; no lymphadenopathy. CHEST:  Clear to P & A; without wheezes or rales- few rhonchi scattered bilat... HEART:  Regular Rhythm; without murmurs/ rubs/ or gallops heard... ABDOMEN:  Soft & nontender; normal bowel sounds; no organomegaly or masses detected. EXT: without deformities, mild arthritic changes; no varicose veins/ +venous insuffic/ no edema. NEURO:  CN's intact; motor testing normal; sensory testing normal; gait normal & balance OK. DERM:  No lesions noted; no rash etc...  MMSE 10/02/10 >> Date 2012 Aug ?day; she didn't know the Pres/ VP/ Gov/ or prev administration; remembered 3/3 objects immed but 0/3 after distraction; serial 7's= [id number]?; WORLD backwards= DLROW; proverbs were mixed- one abstracted (glass houses), others concrete (stitch, etc)...   RADIOLOGY DATA:  Reviewed in the EPIC EMR & discussed w/ the patient & daughter...  LABORATORY DATA:  Reviewed in the [facility] EMR & discussed w/ the patient & daughter...   Assessment & Plan:   Presents very pale, stool brown & weakly heme pos; she has some dyspnea, nausea but otherw neg; we decided to send her to the ER for admission- needs labs, iron studies, transfusion, GI consultation...   Asthma> this has NOT been an issue but she has been maintained on Advair, Mucinex...  HBP> BP controlled well on the Metoprolol 50mg Bid...  HH seen on CXR> may be source of GI blood loss & her iron defic anemia; on OMEPRAZOLE 20mg /d at present...  IBS, Divertics w/ stricture in sigmoid>  See 2005 hosp for diverticulitis, then subseq work  up by [facility] in 2006; states she is regular w/o difficulty.  BREAST CANCER>  Prev followed by DrRubin, she has been off her Arimidex & I don't have any recent notes from him to review...  Osteopenia & ?compression fx ~L1 on CXR> back on Alendronate70/wk, (f/u BMD 2/13 w/ TScore -2.9 in left FemNeck & Alendronate restarted)...  Hx Anemia> ?etiology (never determined w/ certainty) but most likely her mod HH seen on CXR (prev EGD/ Colon 2006 showed smHH, divertics w/ stricture, no lesions seen); Hg 12/12= 12.5, MCV=85==> continue the Fe daily...   1/14> presented for routine 58mo ROV very pale, w/ weakly heme pos stool => sent to ER for admission...  Vit B12 defic> it was 188 in hosp 9/11 & started on B12 shots immed by the hospitalists; f/u level 765 shortly after disch but she never ret for continued shots; B12 level now 242 & we discussed trial of oral supplementation taking po daily...  Dementia> she is seeing DrPlovsky for Psyche> on Aricept 10mg /d.Marland KitchenMarland Kitchen  Other medical problems as noted> aske to continue Calcium, Women's MVI, Vit D 1000u daily & B-12 105mcg/d oral supplements...   Patient's Medications  New Prescriptions   No medications on file  Previous Medications   ADVAIR DISKUS 500-50 MCG/DOSE AEPB    USE 1 INHALATION EVERY 12 HOURS   ALENDRONATE (FOSAMAX) 70 MG TABLET    Take 1 tablet (70 mg total) by mouth every 7 (seven) days. Take with a full glass of water on an empty stomach.   CALCIUM CARBONATE-VITAMIN D (CALTRATE 600+D) 600-400 MG-UNIT PER TABLET    Take 1 tablet by mouth 2 (two) times daily.     CHOLECALCIFEROL (VITAMIN D) 1000 UNITS TABLET    Take 1,000 Units by mouth daily.     DONEPEZIL (ARICEPT) 10 MG TABLET    Take 10 mg by mouth at bedtime.    FERROUS SULFATE 325 (65 FE) MG TABLET    Take 325 mg by mouth daily with breakfast.   METOPROLOL (LOPRESSOR) 50 MG TABLET    Take 1 tablet (50 mg total) by mouth 2 (two) times daily.   OMEPRAZOLE (PRILOSEC) 20 MG CAPSULE     TAKE ONE CAPSULE BY MOUTH EVERY DAY   VITAMIN B-12 (CYANOCOBALAMIN) 1000 MCG TABLET    Take 1,000 mcg by mouth daily.  Modified Medications   No medications on file  Discontinued Medications

## 2012-03-02 NOTE — H&P (Addendum)
[facility]                                                                                    Patient Demographics  [name], is a 79 y.o. female  CSN: [id number]  MRN: [id number]  DOB - [birth year]  Admit Date - 03/02/2012  Outpatient Primary MD for the patient is Loreen Freud, DO   With History of -  Past Medical History  Diagnosis Date  . Allergic rhinitis   . Asthma   . Hypertension   . Chronic rhinitis   . Venous insufficiency   . Hiatal hernia   . IBS (irritable bowel syndrome)   . Diverticulosis of colon   . History of breast cancer   . Low back pain syndrome   . Osteopenia   . Fatigue   . Headache   . Anxiety   . Dementia   . DDD (degenerative disc disease), cervical       Past Surgical History  Procedure Date  . Appendectomy 1950;s  . Right inguinal hernia repair   . Lumbar laminectomy 1989    Dr, Simonne Come  . Laparoscopic cholecystectomy 1992    Dr. Orson Slick  . Right breast lumpectomy/sentinel node bx 06/2005    Dr. Jamey Ripa    in for   Chief Complaint  Patient presents with  . GI Bleeding     HPI  Karsyn Jamie  is a 79 y.o. female, who is not a very reliable historian due to dementia, currently lives alone in a townhouse community, is fairly active, has history of hypertension, breast cancer, hiatal hernia, diverticulosis along with dementia who went for her routine followup visit to physician's office Dr. Alroy Dust, during a routine visit she was found to be extremely pale, blood work suggested extreme anemia and she was referred to the ER, in the ER extreme anemia was confirmed, patient had brown stool on exam which was guaiac positive, she herself denies any history of blood in stool or black colored stool, denies any nausea vomiting or abdominal discomfort, denies any chest pain, palpitations, recent weight loss, does say that she has been getting easily fatigued when she walks her dog in the evening.   Agent further  denies any history of stomach or colon cancer in her or her family. In the ER workup shows hemoglobin of 4.2, BUN normal, she is well compensated with stable heart rate and blood pressure and is fairly asymptomatic besides generalized fatigue. Likely representing chronic to subacute anemia from likely GI blood loss.    Review of Systems    In addition to the HPI above,  No Fever-chills, No Headache, No changes with Vision or hearing, No problems swallowing food or Liquids, No Chest pain, Cough or Shortness of Breath, No Abdominal pain, No Nausea or Vommitting, Bowel movements are regular, No Blood in stool or Urine, No dysuria, No new skin rashes or bruises, No new joints pains-aches,  No new weakness, tingling, numbness in any extremity, does get fatigued easily upon exertion No recent weight gain or loss, No polyuria, polydypsia or polyphagia, No significant Mental Stressors.  A full 10 point Review of Systems was done,  except as stated above, all other Review of Systems were negative.   Social History History  Substance Use Topics  . Smoking status: Former Smoker -- 1.0 packs/day for 32 years    Types: Cigarettes    Quit date: 02/10/1985  . Smokeless tobacco: Not on file  . Alcohol Use: No     Family History Denies any family history of stomach or colon cancer  Prior to Admission medications   Medication Sig Start Date End Date Taking? Authorizing Provider  ADVAIR DISKUS 500-50 MCG/DOSE AEPB USE 1 INHALATION EVERY 12 HOURS 10/19/11   Michele Mcalpine, MD  alendronate (FOSAMAX) 70 MG tablet Take 1 tablet (70 mg total) by mouth every 7 (seven) days. Take with a full glass of water on an empty stomach. 04/03/11 04/02/12  Michele Mcalpine, MD  Calcium Carbonate-Vitamin D (CALTRATE 600+D) 600-400 MG-UNIT per tablet Take 1 tablet by mouth 2 (two) times daily.      Historical Provider, MD  cholecalciferol (VITAMIN D) 1000 UNITS tablet Take 1,000 Units by mouth daily.      Historical  Provider, MD  donepezil (ARICEPT) 10 MG tablet Take 10 mg by mouth at bedtime.     Historical Provider, MD  ferrous sulfate 325 (65 FE) MG tablet Take 325 mg by mouth daily with breakfast.    Historical Provider, MD  metoprolol (LOPRESSOR) 50 MG tablet Take 1 tablet (50 mg total) by mouth 2 (two) times daily. 11/04/10   Michele Mcalpine, MD  omeprazole (PRILOSEC) 20 MG capsule TAKE ONE CAPSULE BY MOUTH EVERY DAY 02/22/12   Michele Mcalpine, MD  vitamin B-12 (CYANOCOBALAMIN) 1000 MCG tablet Take 1,000 mcg by mouth daily.    Historical Provider, MD    Allergies  Allergen Reactions  . Levofloxacin     REACTION: dizziness    Physical Exam  Vitals  Blood pressure 143/57, pulse 72, temperature 97.6 F (36.4 C), temperature source Oral, resp. rate 16, SpO2 99.00%.   1. General pale-appearing elderly frail white female lying in bed in NAD,    2. Normal affect and insight, Not Suicidal or Homicidal, Awake Alert, Oriented X 3.  3. No F.N deficits, ALL C.Nerves Intact, Strength 5/5 all 4 extremities, Sensation intact all 4 extremities, Plantars down going.  4. Ears and Eyes appear Normal, Conjunctivae clear, PERRLA. Moist Oral Mucosa.  5. Supple Neck, No JVD, No cervical lymphadenopathy appriciated, No Carotid Bruits.  6. Symmetrical Chest wall movement, Good air movement bilaterally, CTAB.  7. RRR, No Gallops, Rubs or Murmurs, No Parasternal Heave.  8. Positive Bowel Sounds, Abdomen Soft, Non tender, No organomegaly appriciated,No rebound -guarding or rigidity, stool is brown however it's positive for occult blood  9.  No Cyanosis, Normal Skin Turgor, No Skin Rash or Bruise.  10. Good muscle tone,  joints appear normal , no effusions, Normal ROM.  11. No Palpable Lymph Nodes in Neck or Axillae     Data Review  CBC  Lab 03/02/12 1428  WBC 9.8  HGB 4.2*  HCT 16.5*  PLT 515*  MCV 55.2*  MCH 14.0*  MCHC 25.5*  RDW 20.1*  LYMPHSABS 1.3  MONOABS 1.0  EOSABS 0.2  BASOSABS 0.2*    BANDABS --   ------------------------------------------------------------------------------------------------------------------  Chemistries   Lab 03/02/12 1428  NA 135  K 3.2*  CL 101  CO2 21  GLUCOSE 102*  BUN 6  CREATININE 0.67  CALCIUM 8.5  MG --  AST --  ALT --  ALKPHOS --  BILITOT --   ------------------------------------------------------------------------------------------------------------------ CrCl is unknown because both a height and weight (above a minimum accepted value) are required for this calculation. ------------------------------------------------------------------------------------------------------------------ No results found for this basename: TSH,T4TOTAL,FREET3,T3FREE,THYROIDAB in the last 72 hours   Coagulation profile  Lab 03/02/12 1428  INR 1.19  PROTIME --   ------------------------------------------------------------------------------------------------------------------- No results found for this basename: DDIMER:2 in the last 72 hours -------------------------------------------------------------------------------------------------------------------  Cardiac Enzymes  Lab 03/02/12 1428  CKMB --  TROPONINI <0.30  MYOGLOBIN --   ------------------------------------------------------------------------------------------------------------------ No components found with this basename: POCBNP:3   ---------------------------------------------------------------------------------------------------------------  Urinalysis    Component Value Date/Time   COLORURINE YELLOW 02/03/2011 1827   APPEARANCEUR CLEAR 02/03/2011 1827   LABSPEC 1.012 02/03/2011 1827   PHURINE 7.0 02/03/2011 1827   GLUCOSEU 250* 02/03/2011 1827   HGBUR NEGATIVE 02/03/2011 1827   BILIRUBINUR NEGATIVE 02/03/2011 1827   KETONESUR 40* 02/03/2011 1827   PROTEINUR NEGATIVE 02/03/2011 1827   UROBILINOGEN 0.2 02/03/2011 1827   NITRITE NEGATIVE 02/03/2011 1827    LEUKOCYTESUR NEGATIVE 02/03/2011 1827    ----------------------------------------------------------------------------------------------------------------  Imaging results:   No results found.  My personal review of EKG: Rhythm NSR, Rate  70s /min, QTc 512 ,  no Acute ST changes   Ordered baseline chest x-ray with  Abdomen view.   Assessment & Plan   1. Severe anemia likely subacute to chronic(has history of iron and B12 deficiency anemia in the past) - stool is guaiac positive although brown, BUN is normal, likely lower GI bleed, she does have history of diverticulosis, denies any history of unintentional weight loss, at this point patient will be admitted to the hospital on a telemetry bed, she appears well compensated, anemia panel, 3 units of packed RBC transfusion, GI Dr. Madilyn Fireman GI see the patient later. We'll keep her on clears for now, gentle IV fluid. Home dose IM and B12 supplementation will be continued.   2.HTN- continue home dose Lopressor, incidentally on EKG QTC slightly prolonged with give her a gram of mag and monitor mag levels in the morning, we'll try and avoid QTC prolonging agents. Monitor on telemetry.   3. History of osteoporosis. For now Fosamax will be held, I doubt this upper GI bleed however she has history of GERD, continue calcium vitamin D supplementation.   4. History of GERD. Changed to IV PPI for now, no history of melena, BUN normal, unlikely large acute upper GI bleed.   5. History of dementia but appears to be moderate to advanced, she is pleasantly confused, is at risk for delirium, will observe closely on telemetry, avoid benzodiazepines.   DVT Prophylaxis SCDs   AM Labs Ordered, also please review Full Orders  Family Communication: Admission, patients condition and plan of care including tests being ordered have been discussed with the patient who indicate understanding and agree with the plan and Code Status.  Message left for Daughter. Kim  phone number [phone]  Code Status Full  Likely DC to  Home  Time spent in minutes : 35  Condition Marinell Blight K M.D on 03/02/2012 at 4:29 PM  Between 7am to 7pm - Pager - [phone]  After 7pm go to [email or web address] - password [email or web address]  And look for the night coverage person covering me after hours  [facility] Office  [phone]

## 2012-03-02 NOTE — Patient Instructions (Signed)
We are sending you to the ER for admission due to severe anemia.Marland KitchenMarland Kitchen

## 2012-03-02 NOTE — ED Notes (Signed)
Sent here from [facility] and was there with routine check and was found to be pale.  Pt here with possible gi and was to come here.  Pt has low platelets and low iron.  Pt is alert and pale with bp in the 80s systolic

## 2012-03-02 NOTE — ED Notes (Signed)
Critical hgb reported to Dr.Nanavati

## 2012-03-03 DIAGNOSIS — D508 Other iron deficiency anemias: Secondary | ICD-10-CM

## 2012-03-03 DIAGNOSIS — I10 Essential (primary) hypertension: Secondary | ICD-10-CM

## 2012-03-03 LAB — BASIC METABOLIC PANEL
BUN: 3 mg/dL — ABNORMAL LOW (ref 6–23)
CO2: 21 mEq/L (ref 19–32)
Chloride: 104 mEq/L (ref 96–112)
Creatinine, Ser: 0.6 mg/dL (ref 0.50–1.10)
GFR calc Af Amer: >90 mL/min (ref >90)
Glucose, Bld: 82 mg/dL (ref 70–99)
Potassium: 3.5 mEq/L (ref 3.5–5.1)

## 2012-03-03 LAB — CBC
HCT: 28.3 % — ABNORMAL LOW (ref 36.0–46.0)
Hemoglobin: 8.7 g/dL — ABNORMAL LOW (ref 12.0–15.0)
MCH: 20.6 pg — ABNORMAL LOW (ref 26.0–34.0)
MCHC: 30.7 g/dL (ref 30.0–36.0)
MCV: 66.9 fL — ABNORMAL LOW (ref 78.0–100.0)

## 2012-03-03 MED ORDER — FUROSEMIDE 10 MG/ML IJ SOLN
20.0000 mg | Freq: Once | INTRAMUSCULAR | Status: AC
  Administered 2012-03-03: 20 mg via INTRAVENOUS

## 2012-03-03 MED ORDER — FUROSEMIDE 10 MG/ML IJ SOLN
INTRAMUSCULAR | Status: AC
  Filled 2012-03-03: qty 2

## 2012-03-03 NOTE — Progress Notes (Signed)
Utilization review completed. Brande Uncapher, RN, BSN. 

## 2012-03-03 NOTE — Progress Notes (Signed)
[facility] PROGRESS NOTE  [name] UJW:[id number] DOB: 02-28-1933 DOA: 03/02/2012 PCP: Loreen Freud, DO  Assessment/Plan: 1. Severe anemia likely subacute to chronic(has history of iron and B12 deficiency anemia in the past) - stool is guaiac positive although brown, BUN is normal, likely lower GI bleed, she does have history of diverticulosis, denies any history of unintentional weight loss,she appears well compensated, anemia panel, 3 units of packed RBC transfusion, GI Dr. Madilyn Fireman saw the patient today.  We'll keep her on clears for now, gentle IV fluid. Home dose IM and B12 supplementation will be continued. Her H&h is 8.7/29 after the prbc transfusions. She is not actively bleeding. Will continue to monitor. Her anemia panel reveals low ferritin of 3 with normal folate and B12 levels.  2.HTN- continue home dose Lopressor,. 3. History of osteoporosis. For now Fosamax will be held, I doubt this upper GI bleed however she has history of GERD, continue calcium vitamin D supplementation.  4. History of GERD. Changed to IV PPI for now, no history of melena, BUN normal, unlikely large acute upper GI bleed.  5. History of dementia but appears to be moderate to advanced, she is pleasantly confused, is at risk for delirium, will observe closely on telemetry, avoid benzodiazepines.  Code Status: full  Family Communication: none Disposition Plan: PENDING    Consultants: GI  HPI/Subjective:  NO new complaints.   Objective: Filed Vitals:   03/03/12 0002 03/03/12 0058 03/03/12 0204 03/03/12 0654  BP: 148/53 156/60 149/62 132/77  Pulse: 75 75 75 68  Temp: 98.2 F (36.8 C) 98.3 F (36.8 C) 97.9 F (36.6 C) 98.1 F (36.7 C)  TempSrc: Oral Oral  Oral  Resp:  16 16 16   SpO2: 99% 99% 98% 97%    Intake/Output Summary (Last 24 hours) at 03/03/12 5621 Last data filed at 03/03/12 0630  Gross per 24 hour  Intake  947.5 ml  Output    600 ml  Net  347.5 ml   There were no vitals  filed for this visit.  Exam:   General:  Alert afebrile sitting on the bed chatting  Cardiovascular: s1s2  Respiratory: CTAB  Abdomen: soft NT ND BS+  Data Reviewed: Basic Metabolic Panel:  Lab 03/03/12 3086 03/02/12 1428  NA 136 135  K 3.5 3.2*  CL 104 101  CO2 21 21  GLUCOSE 82 102*  BUN 3* 6  CREATININE 0.60 0.67  CALCIUM 8.5 8.5  MG 2.1 --  PHOS -- --   Liver Function Tests: No results found for this basename: AST:5,ALT:5,ALKPHOS:5,BILITOT:5,PROT:5,ALBUMIN:5 in the last 168 hours No results found for this basename: LIPASE:5,AMYLASE:5 in the last 168 hours No results found for this basename: AMMONIA:5 in the last 168 hours CBC:  Lab 03/02/12 2141 03/02/12 1605 03/02/12 1428  WBC -- -- 9.8  NEUTROABS -- -- 7.1  HGB 7.1* 4.1* 4.2*  HCT 24.5* 16.0* 16.5*  MCV -- -- 55.2*  PLT -- -- 515*   Cardiac Enzymes:  Lab 03/02/12 1428  CKTOTAL --  CKMB --  CKMBINDEX --  TROPONINI <0.30   BNP (last 3 results) No results found for this basename: PROBNP:3 in the last 8760 hours CBG: No results found for this basename: GLUCAP:5 in the last 168 hours  No results found for this or any previous visit (from the past 240 hour(s)).   Studies: Dg Abd Acute W/chest  03/02/2012  *RADIOLOGY REPORT*  Clinical Data: Cough.  Altered mental status.  ACUTE ABDOMEN SERIES (ABDOMEN 2  VIEW & CHEST 1 VIEW)  Comparison: Single view of the chest 02/03/2011.  Findings: The patient has a small left pleural effusion and basilar airspace disease.  Right lung is clear.  There is cardiomegaly and vascular congestion.  Two views of the abdomen show a normal bowel gas pattern.  No free intraperitoneal air is identified.  Severe degenerative disease about the right hip is noted. Milder degree of left hip osteoarthritis is also present.  IMPRESSION:  1.  Small left effusion and basilar airspace disease which could be due to atelectasis or pneumonia. 2.  Cardiomegaly and pulmonary vascular congestion. 3.   Benign appearing abdomen. 4.  Bilateral hip degenerative disease, much worse on the right, where it is severe.   Original Report Authenticated By: Holley Dexter, M.D.     Scheduled Meds:   . calcium-vitamin D  1 tablet Oral BID  . cholecalciferol  1,000 Units Oral Daily  . donepezil  10 mg Oral QHS  . ferrous sulfate  325 mg Oral Q breakfast  . furosemide      . metoprolol  50 mg Oral BID  . mometasone-formoterol  2 puff Inhalation BID  . pantoprazole (PROTONIX) IV  40 mg Intravenous Q12H  . sodium chloride  3 mL Intravenous Q12H  . vitamin B-12  1,000 mcg Oral Daily   Continuous Infusions:   . 0.9 % NaCl with KCl 20 mEq / L 20 mL/hr at 03/03/12 0802    Active Problems:  ANEMIA, IRON DEFICIENCY, MICROCYTIC  ANEMIA, B12 DEFICIENCY  DEMENTIA  HYPERTENSION  HIATAL HERNIA  DIVERTICULOSIS OF COLON        [name]  [facility] Pager [phone]. If 8PM-8AM, please contact night-coverage at [email or web address], password [facility] 03/03/2012, 8:12 AM  LOS: 1 day

## 2012-03-03 NOTE — Consult Note (Signed)
[facility] Consult Note  Referring Provider: No ref. provider found Primary Care Physician:  Loreen Freud, DO Primary Gastroenterologist:  Dr.  Antony Contras Complaint: Admitted for anemia HPI: [name] is an 79 y.o. white female  who was seen by her primary care physician for regular followup and was found to be pale and weak-appearing. Blood work was drawn and showed a hemoglobin of 4.1 with a platelet count of 515,000. BUN and creatinine were normal. She was sent here for admission transfusion and workup. The patient denies noting any blood or black stool and really denies any significant GI symptoms such as anorexia nausea vomiting early satiety dysphagia or abdominal pain. She had a colonoscopy and EGD in 2006 for dyspepsia and colon cancer screening. There is a difficult passage with significant diverticular disease at 30 cm and was unable to pass a pediatric colonoscope beyond it. I was able to get a standard endoscope beyond it but it was not long enough to reach the cecum and only reach the transverse colon. A followup barium enema revealed no neoplasm. Apparently the patient has some dementia not sure that she makes her own medical decisions  Past Medical History  Diagnosis Date  . Allergic rhinitis   . Asthma   . Hypertension   . Chronic rhinitis   . Venous insufficiency   . Hiatal hernia   . IBS (irritable bowel syndrome)   . Diverticulosis of colon   . History of breast cancer   . Low back pain syndrome   . Osteopenia   . Fatigue   . Headache   . Anxiety   . Dementia   . DDD (degenerative disc disease), cervical     Past Surgical History  Procedure Date  . Appendectomy 1950;s  . Right inguinal hernia repair   . Lumbar laminectomy 1989    Dr, Simonne Come  . Laparoscopic cholecystectomy 1992    Dr. Orson Slick  . Right breast lumpectomy/sentinel node bx 06/2005    Dr. Jamey Ripa    Medications Prior to Admission  Medication Sig Dispense Refill  . alendronate  (FOSAMAX) 70 MG tablet Take 1 tablet (70 mg total) by mouth every 7 (seven) days. Take with a full glass of water on an empty stomach.  4 tablet  PRN  . Calcium Carbonate-Vitamin D (CALTRATE 600+D) 600-400 MG-UNIT per tablet Take 1 tablet by mouth 2 (two) times daily.        . cholecalciferol (VITAMIN D) 1000 UNITS tablet Take 1,000 Units by mouth daily.        Marland Kitchen donepezil (ARICEPT) 10 MG tablet Take 10 mg by mouth at bedtime.       . ferrous sulfate 325 (65 FE) MG tablet Take 325 mg by mouth daily with breakfast.      . Fluticasone-Salmeterol (ADVAIR) 500-50 MCG/DOSE AEPB Inhale 1 puff into the lungs every 12 (twelve) hours.      . metoprolol (LOPRESSOR) 50 MG tablet Take 1 tablet (50 mg total) by mouth 2 (two) times daily.  60 tablet  11  . omeprazole (PRILOSEC) 20 MG capsule TAKE ONE CAPSULE BY MOUTH EVERY DAY  30 capsule  2  . vitamin B-12 (CYANOCOBALAMIN) 1000 MCG tablet Take 1,000 mcg by mouth daily.        Allergies:  Allergies  Allergen Reactions  . Levofloxacin     REACTION: dizziness    No family history on file.  Social History:  reports that she quit smoking about 27 years ago. Her smoking use  included Cigarettes. She has a 32 pack-year smoking history. She does not have any smokeless tobacco history on file. She reports that she does not drink alcohol or use illicit drugs.  Review of Systems: negative except as above   Blood pressure 132/77, pulse 68, temperature 98.1 F (36.7 C), temperature source Oral, resp. rate 16, SpO2 98.00%. Head: Normocephalic, without obvious abnormality, atraumatic Neck: no adenopathy, no carotid bruit, no JVD, supple, symmetrical, trachea midline and thyroid not enlarged, symmetric, no tenderness/mass/nodules Resp: clear to auscultation bilaterally Cardio: regular rate and rhythm, S1, S2 normal, no murmur, click, rub or gallop GI: Abdomen soft nondistended with normoactive bowel sounds. No hepatosplenomegaly mass or guarding Extremities:  extremities normal, atraumatic, no cyanosis or edema  Results for orders placed during the hospital encounter of 03/02/12 (from the past 48 hour(s))  BASIC METABOLIC PANEL     Status: Abnormal   Collection Time   03/02/12  2:28 PM      Component Value Range Comment   Sodium 135  135 - 145 mEq/L    Potassium 3.2 (*) 3.5 - 5.1 mEq/L    Chloride 101  96 - 112 mEq/L    CO2 21  19 - 32 mEq/L    Glucose, Bld 102 (*) 70 - 99 mg/dL    BUN 6  6 - 23 mg/dL    Creatinine, Ser 1.61  0.50 - 1.10 mg/dL    Calcium 8.5  8.4 - 09.6 mg/dL    GFR calc non Af Amer 83 (*) >90 mL/min    GFR calc Af Amer >90  >90 mL/min   CBC WITH DIFFERENTIAL     Status: Abnormal   Collection Time   03/02/12  2:28 PM      Component Value Range Comment   WBC 9.8  4.0 - 10.5 K/uL    RBC 2.99 (*) 3.87 - 5.11 MIL/uL    Hemoglobin 4.2 (*) 12.0 - 15.0 g/dL    HCT 04.5 (*) 40.9 - 46.0 %    MCV 55.2 (*) 78.0 - 100.0 fL    MCH 14.0 (*) 26.0 - 34.0 pg    MCHC 25.5 (*) 30.0 - 36.0 g/dL    RDW 81.1 (*) 91.4 - 15.5 %    Platelets 515 (*) 150 - 400 K/uL    Neutrophils Relative 73  43 - 77 %    Lymphocytes Relative 13  12 - 46 %    Monocytes Relative 10  3 - 12 %    Eosinophils Relative 2  0 - 5 %    Basophils Relative 2 (*) 0 - 1 %    Neutro Abs 7.1  1.7 - 7.7 K/uL    Lymphs Abs 1.3  0.7 - 4.0 K/uL    Monocytes Absolute 1.0  0.1 - 1.0 K/uL    Eosinophils Absolute 0.2  0.0 - 0.7 K/uL    Basophils Absolute 0.2 (*) 0.0 - 0.1 K/uL    RBC Morphology POLYCHROMASIA PRESENT      Smear Review LARGE PLATELETS PRESENT     PROTIME-INR     Status: Normal   Collection Time   03/02/12  2:28 PM      Component Value Range Comment   Prothrombin Time 14.9  11.6 - 15.2 seconds    INR 1.19  0.00 - 1.49   TROPONIN I     Status: Normal   Collection Time   03/02/12  2:28 PM      Component Value Range Comment  Troponin I <0.30  <0.30 ng/mL   APTT     Status: Normal   Collection Time   03/02/12  2:28 PM      Component Value Range Comment    aPTT 30  24 - 37 seconds   TYPE AND SCREEN     Status: Normal (Preliminary result)   Collection Time   03/02/12  2:45 PM      Component Value Range Comment   ABO/RH(D) AB POS      Antibody Screen NEG      Sample Expiration 03/05/2012      Unit Number [id number]      Blood Component Type RED CELLS,LR      Unit division 00      Status of Unit ISSUED,FINAL      Transfusion Status OK TO TRANSFUSE      Crossmatch Result Compatible      Unit Number [id number]      Blood Component Type RED CELLS,LR      Unit division 00      Status of Unit ISSUED,FINAL      Transfusion Status OK TO TRANSFUSE      Crossmatch Result Compatible      Unit Number [id number]      Blood Component Type RED CELLS,LR      Unit division 00      Status of Unit ISSUED,FINAL      Transfusion Status OK TO TRANSFUSE      Crossmatch Result Compatible      Unit Number [id number]      Blood Component Type RED CELLS,LR      Unit division 00      Status of Unit ALLOCATED      Transfusion Status OK TO TRANSFUSE      Crossmatch Result Compatible     PREPARE RBC (CROSSMATCH)     Status: Normal   Collection Time   03/02/12  3:41 PM      Component Value Range Comment   Order Confirmation ORDER PROCESSED BY BLOOD BANK     VITAMIN B12     Status: Normal   Collection Time   03/02/12  4:05 PM      Component Value Range Comment   Vitamin B-12 628  211 - 911 pg/mL   FOLATE     Status: Normal   Collection Time   03/02/12  4:05 PM      Component Value Range Comment   Folate 8.2     IRON AND TIBC     Status: Abnormal   Collection Time   03/02/12  4:05 PM      Component Value Range Comment   Iron <10 (*) 42 - 135 ug/dL    TIBC Not calculated due to Iron <10.  250 - 470 ug/dL    Saturation Ratios Not calculated due to Iron <10.  20 - 55 %    UIBC 417 (*) 125 - 400 ug/dL   FERRITIN     Status: Abnormal   Collection Time   03/02/12  4:05 PM      Component Value Range Comment   Ferritin 3 (*) 10 - 291 ng/mL     RETICULOCYTES     Status: Abnormal   Collection Time   03/02/12  4:05 PM      Component Value Range Comment   Retic Ct Pct 1.5  0.4 - 3.1 %    RBC. 2.90 (*) 3.87 - 5.11 MIL/uL    Retic Count, Manual 43.5  19.0 - 186.0 K/uL   HEMOGLOBIN AND HEMATOCRIT, BLOOD     Status: Abnormal   Collection Time   03/02/12  4:05 PM      Component Value Range Comment   Hemoglobin 4.1 (*) 12.0 - 15.0 g/dL    HCT 16.1 (*) 09.6 - 46.0 %   HEMOGLOBIN AND HEMATOCRIT, BLOOD     Status: Abnormal   Collection Time   03/02/12  9:41 PM      Component Value Range Comment   Hemoglobin 7.1 (*) 12.0 - 15.0 g/dL POST TRANSFUSION SPECIMEN   HCT 24.5 (*) 36.0 - 46.0 %   TSH     Status: Normal   Collection Time   03/02/12  9:41 PM      Component Value Range Comment   TSH 0.461  0.350 - 4.500 uIU/mL   MAGNESIUM     Status: Normal   Collection Time   03/03/12  7:05 AM      Component Value Range Comment   Magnesium 2.1  1.5 - 2.5 mg/dL   BASIC METABOLIC PANEL     Status: Abnormal   Collection Time   03/03/12  7:05 AM      Component Value Range Comment   Sodium 136  135 - 145 mEq/L    Potassium 3.5  3.5 - 5.1 mEq/L    Chloride 104  96 - 112 mEq/L    CO2 21  19 - 32 mEq/L    Glucose, Bld 82  70 - 99 mg/dL    BUN 3 (*) 6 - 23 mg/dL    Creatinine, Ser 0.45  0.50 - 1.10 mg/dL    Calcium 8.5  8.4 - 40.9 mg/dL    GFR calc non Af Amer 86 (*) >90 mL/min    GFR calc Af Amer >90  >90 mL/min   CBC     Status: Abnormal   Collection Time   03/03/12  7:05 AM      Component Value Range Comment   WBC 9.0  4.0 - 10.5 K/uL    RBC 4.23  3.87 - 5.11 MIL/uL    Hemoglobin 8.7 (*) 12.0 - 15.0 g/dL POST TRANSFUSION SPECIMEN   HCT 28.3 (*) 36.0 - 46.0 %    MCV 66.9 (*) 78.0 - 100.0 fL    MCH 20.6 (*) 26.0 - 34.0 pg    MCHC 30.7  30.0 - 36.0 g/dL    RDW NOT CALCULATED  11.5 - 15.5 % MIXED RBC POPULATION   Platelets 423 (*) 150 - 400 K/uL    Dg Abd Acute W/chest  03/02/2012  *RADIOLOGY REPORT*  Clinical Data: Cough.  Altered  mental status.  ACUTE ABDOMEN SERIES (ABDOMEN 2 VIEW & CHEST 1 VIEW)  Comparison: Single view of the chest 02/03/2011.  Findings: The patient has a small left pleural effusion and basilar airspace disease.  Right lung is clear.  There is cardiomegaly and vascular congestion.  Two views of the abdomen show a normal bowel gas pattern.  No free intraperitoneal air is identified.  Severe degenerative disease about the right hip is noted. Milder degree of left hip osteoarthritis is also present.  IMPRESSION:  1.  Small left effusion and basilar airspace disease which could be due to atelectasis or pneumonia. 2.  Cardiomegaly and pulmonary vascular congestion. 3.  Benign appearing abdomen. 4.  Bilateral hip degenerative disease, much worse on the right, where it is severe.   Original Report Authenticated By: Holley Dexter, M.D.  Assessment: Anemia and heme positive stool with last colonoscopy/barium enema/EGD 8 years ago Plan: Would transfuse to a hemoglobin of at least 8 Ideally would attempt EGD followed by colonoscopy and if colonoscopy incomplete repeat barium enema. In discussion with patient she did not seem to commit herself to do this and I am not completely sure that she is considered competent or has her own medical power of attorney. She was wanted to discuss it further were the other doctors whom she did not identify and I thought it best to hold off on attempting to prep her today for procedures tomorrow. Will revisit tomorrow and see if she is agreeable. Hansini Clodfelter C 03/03/2012, 11:40 AM

## 2012-03-04 ENCOUNTER — Encounter (HOSPITAL_COMMUNITY): Payer: Self-pay | Admitting: General Practice

## 2012-03-04 DIAGNOSIS — F068 Other specified mental disorders due to known physiological condition: Secondary | ICD-10-CM

## 2012-03-04 LAB — BASIC METABOLIC PANEL
BUN: 3 mg/dL — ABNORMAL LOW (ref 6–23)
CO2: 23 mEq/L (ref 19–32)
Chloride: 102 mEq/L (ref 96–112)
GFR calc Af Amer: >90 mL/min (ref >90)
Potassium: 3.3 mEq/L — ABNORMAL LOW (ref 3.5–5.1)

## 2012-03-04 LAB — HEMOGLOBIN AND HEMATOCRIT, BLOOD: Hemoglobin: 8.9 g/dL — ABNORMAL LOW (ref 12.0–15.0)

## 2012-03-04 MED ORDER — SODIUM CHLORIDE 0.9 % IV SOLN: INTRAVENOUS | Status: DC

## 2012-03-04 MED ORDER — PEG 3350-KCL-NA BICARB-NACL 420 G PO SOLR
4000.0000 mL | Freq: Once | ORAL | Status: AC
  Administered 2012-03-04: 4000 mL via ORAL
  Filled 2012-03-04: qty 4000

## 2012-03-04 MED ORDER — SODIUM CHLORIDE 0.9 % IV SOLN
INTRAVENOUS | Status: DC
  Administered 2012-03-05: 500 mL via INTRAVENOUS

## 2012-03-04 MED ORDER — POTASSIUM CHLORIDE CRYS ER 20 MEQ PO TBCR
40.0000 meq | EXTENDED_RELEASE_TABLET | Freq: Two times a day (BID) | ORAL | Status: AC
  Administered 2012-03-04 (×2): 40 meq via ORAL
  Filled 2012-03-04 (×2): qty 2

## 2012-03-04 NOTE — Evaluation (Signed)
Physical Therapy Evaluation Patient Details Name: [name] MRN: [id number] DOB: [birth year] Today's Date: 03/04/2012 Time: 4098-1191 PT Time Calculation (min): 19 min  PT Assessment / Plan / Recommendation Clinical Impression  Pt admitted with anemia. Unsure of patients baseline functional status as pt with dementia and no caregiver present. Will continue to assess for fall risk upon d/c.     PT Assessment  Patient needs continued PT services    Follow Up Recommendations  No PT follow up;Supervision for mobility/OOB    Does the patient have the potential to tolerate intense rehabilitation      Barriers to Discharge        Equipment Recommendations  Rolling walker with 5" wheels    Recommendations for Other Services     Frequency Min 3X/week    Precautions / Restrictions Precautions Precautions: Fall Restrictions Weight Bearing Restrictions: No   Pertinent Vitals/Pain No complaints of pain      Mobility  Bed Mobility Bed Mobility: Supine to Sit;Sit to Supine Supine to Sit: 6: Modified independent (Device/Increase time) Sit to Supine: 6: Modified independent (Device/Increase time) Transfers Transfers: Sit to Stand;Stand to Sit Sit to Stand: 4: Min assist;With upper extremity assist;From bed Stand to Sit: 4: Min assist;With upper extremity assist;To bed;To chair/3-in-1 Details for Transfer Assistance: Min assist for support.  Ambulation/Gait Ambulation/Gait Assistance: 4: Min assist;4: Min Government social research officer (Feet): 200 Feet Assistive device: 1 person hand held assist;Rolling walker Ambulation/Gait Assistance Details: Minguard assist with Rw with cueing for safe technique and distance to RW. Min assist without RW. Pt with left limp as well as decreased hip flexion of LLE Gait Pattern: Decreased hip/knee flexion - left;Step-to pattern Gait velocity: slow Stairs: No    Shoulder Instructions     Exercises     PT Diagnosis: Difficulty walking  PT  Problem List: Decreased activity tolerance;Decreased balance;Decreased mobility;Decreased knowledge of use of DME;Decreased knowledge of precautions;Decreased safety awareness PT Treatment Interventions: DME instruction;Gait training;Stair training;Functional mobility training;Therapeutic activities;Balance training;Patient/family education   PT Goals Acute Rehab PT Goals PT Goal Formulation: With patient Time For Goal Achievement: 03/11/12 Potential to Achieve Goals: Fair Pt will go Sit to Stand: with modified independence PT Goal: Sit to Stand - Progress: Goal set today Pt will go Stand to Sit: with modified independence PT Goal: Stand to Sit - Progress: Goal set today Pt will Transfer Bed to Chair/Chair to Bed: with modified independence PT Transfer Goal: Bed to Chair/Chair to Bed - Progress: Goal set today Pt will Ambulate: >150 feet;with modified independence;with least restrictive assistive device PT Goal: Ambulate - Progress: Goal set today Pt will Go Up / Down Stairs: 1-2 stairs;with supervision;with rail(s) PT Goal: Up/Down Stairs - Progress: Goal set today Additional Goals Additional Goal #1: Pt will score >19 on the DGI indiciating a decreased fall risk PT Goal: Additional Goal #1 - Progress: Goal set today  Visit Information  Last PT Received On: 03/04/12 Assistance Needed: +1    Subjective Data      Prior Functioning  Home Living Lives With: Alone Available Help at Discharge: Friend(s);Available PRN/intermittently Type of Home: House Home Access: Stairs to enter [employer] of Steps: 2 Entrance Stairs-Rails: Right Home Layout: One level Bathroom Shower/Tub: Tub/shower unit;Walk-in shower (has both) Teacher, early years/pre: Yes How Accessible: Accessible via walker Home Adaptive Equipment: Straight cane;Walker - rolling Prior Function Level of Independence: Independent with assistive device(s) Able to Take Stairs?:  Yes Driving: Yes Vocation: Retired Musician: No difficulties Dominant  Hand: Right    Cognition  Overall Cognitive Status: No family/caregiver present to determine baseline cognitive functioning Arousal/Alertness: Awake/alert Orientation Level: Disoriented to;Situation;Time Behavior During Session: [facility] for tasks performed    Extremity/Trunk Assessment Right Lower Extremity Assessment RLE ROM/Strength/Tone: Within functional levels Left Lower Extremity Assessment LLE ROM/Strength/Tone: Deficits LLE ROM/Strength/Tone Deficits: grossly 4/5. Pt complained of left hip pain   Balance    End of Session PT - End of Session Equipment Utilized During Treatment: Gait belt Activity Tolerance: Patient tolerated treatment well Patient left: in chair;with call bell/phone within reach;Other (comment) (sitter in room) Nurse Communication: Mobility status  GP     Milana Kidney 03/04/2012, 3:38 PM  03/04/2012 Milana Kidney DPT PAGER: [phone] OFFICE: [phone]

## 2012-03-04 NOTE — Progress Notes (Signed)
[facility] PROGRESS NOTE  DOTSIE GILLETTE JWJ:[id number] DOB: Oct 30, 1933 DOA: 03/02/2012 PCP: Loreen Freud, DO  Assessment/Plan: 1. Severe anemia likely subacute to chronic(has history of iron and B12 deficiency anemia in the past) - stool is guaiac positive although brown, BUN is normal, likely lower GI bleed, she does have history of diverticulosis, denies any history of unintentional weight loss,she appears well compensated, anemia panel, 3 units of packed RBC transfusion, GI Dr. Madilyn Fireman saw the patient today.  We'll keep her on clears for now, gentle IV fluid. Home dose IM and B12 supplementation will be continued. Her H&h is 8.9/29 after the prbc transfusions. She is not actively bleeding. Will continue to monitor. Her anemia panel reveals low ferritin of 3 with normal folate and B12 levels.  2.HTN- continue home dose Lopressor,. 3. History of osteoporosis. For now Fosamax will be held, I doubt this upper GI bleed however she has history of GERD, continue calcium vitamin D supplementation.  4. History of GERD. Changed to IV PPI for now, no history of melena, BUN normal, unlikely large acute upper GI bleed.  5. History of dementia but appears to be moderate to advanced, she is pleasantly confused, is at risk for delirium, will observe closely on telemetry, avoid benzodiazepines.  Code Status: full  Family Communication: spoke to her daughter and she wants her mom to get the procedure.  Disposition Plan: PENDING    Consultants: GI  HPI/Subjective:  NO new complaints.   Objective: Filed Vitals:   03/03/12 2053 03/04/12 0122 03/04/12 0500 03/04/12 0857  BP: 149/64 160/59 151/54   Pulse: 71 62 57   Temp: 97.3 F (36.3 C) 97.4 F (36.3 C) 97.7 F (36.5 C)   TempSrc: Oral Oral Oral   Resp: 18 16 18    SpO2: 100% 100% 100% 100%   No intake or output data in the 24 hours ending 03/04/12 0916 There were no vitals filed for this visit.  Exam:   General:  Alert afebrile sitting  on the bed chatting  Cardiovascular: s1s2  Respiratory: CTAB  Abdomen: soft NT ND BS+  Data Reviewed: Basic Metabolic Panel:  Lab 03/03/12 6213 03/02/12 1428  NA 136 135  K 3.5 3.2*  CL 104 101  CO2 21 21  GLUCOSE 82 102*  BUN 3* 6  CREATININE 0.60 0.67  CALCIUM 8.5 8.5  MG 2.1 --  PHOS -- --   Liver Function Tests: No results found for this basename: AST:5,ALT:5,ALKPHOS:5,BILITOT:5,PROT:5,ALBUMIN:5 in the last 168 hours No results found for this basename: LIPASE:5,AMYLASE:5 in the last 168 hours No results found for this basename: AMMONIA:5 in the last 168 hours CBC:  Lab 03/03/12 0705 03/02/12 2141 03/02/12 1605 03/02/12 1428  WBC 9.0 -- -- 9.8  NEUTROABS -- -- -- 7.1  HGB 8.7* 7.1* 4.1* 4.2*  HCT 28.3* 24.5* 16.0* 16.5*  MCV 66.9* -- -- 55.2*  PLT 423* -- -- 515*   Cardiac Enzymes:  Lab 03/02/12 1428  CKTOTAL --  CKMB --  CKMBINDEX --  TROPONINI <0.30   BNP (last 3 results) No results found for this basename: PROBNP:3 in the last 8760 hours CBG: No results found for this basename: GLUCAP:5 in the last 168 hours  No results found for this or any previous visit (from the past 240 hour(s)).   Studies: Dg Abd Acute W/chest  03/02/2012  *RADIOLOGY REPORT*  Clinical Data: Cough.  Altered mental status.  ACUTE ABDOMEN SERIES (ABDOMEN 2 VIEW & CHEST 1 VIEW)  Comparison: Single view  of the chest 02/03/2011.  Findings: The patient has a small left pleural effusion and basilar airspace disease.  Right lung is clear.  There is cardiomegaly and vascular congestion.  Two views of the abdomen show a normal bowel gas pattern.  No free intraperitoneal air is identified.  Severe degenerative disease about the right hip is noted. Milder degree of left hip osteoarthritis is also present.  IMPRESSION:  1.  Small left effusion and basilar airspace disease which could be due to atelectasis or pneumonia. 2.  Cardiomegaly and pulmonary vascular congestion. 3.  Benign appearing  abdomen. 4.  Bilateral hip degenerative disease, much worse on the right, where it is severe.   Original Report Authenticated By: Holley Dexter, M.D.     Scheduled Meds:    . calcium-vitamin D  1 tablet Oral BID  . cholecalciferol  1,000 Units Oral Daily  . donepezil  10 mg Oral QHS  . ferrous sulfate  325 mg Oral Q breakfast  . metoprolol  50 mg Oral BID  . mometasone-formoterol  2 puff Inhalation BID  . pantoprazole (PROTONIX) IV  40 mg Intravenous Q12H  . sodium chloride  3 mL Intravenous Q12H  . vitamin B-12  1,000 mcg Oral Daily   Continuous Infusions:   Active Problems:  ANEMIA, IRON DEFICIENCY, MICROCYTIC  ANEMIA, B12 DEFICIENCY  DEMENTIA  HYPERTENSION  HIATAL HERNIA  DIVERTICULOSIS OF COLON        Arelyn Gauer  [facility] Pager [phone]. If 8PM-8AM, please contact night-coverage at [email or web address], password [email or web address] 03/04/2012, 9:16 AM  LOS: 2 days

## 2012-03-04 NOTE — Progress Notes (Signed)
Pt once again declines colon/EGD. Noted that daughter wants her to have it but pt states she makes her own medical decisions and  once again she declined. Will be available to pursue as out- or inpatient if she agrees.

## 2012-03-05 ENCOUNTER — Encounter (HOSPITAL_COMMUNITY): Payer: Self-pay | Admitting: Gastroenterology

## 2012-03-05 ENCOUNTER — Encounter (HOSPITAL_COMMUNITY)
Admission: EM | Disposition: A | Discharge status: Home / Self Care | Payer: Self-pay | Source: Home / Self Care | Attending: Internal Medicine

## 2012-03-05 DIAGNOSIS — K5730 Diverticulosis of large intestine without perforation or abscess without bleeding: Secondary | ICD-10-CM

## 2012-03-05 DIAGNOSIS — D649 Anemia, unspecified: Secondary | ICD-10-CM

## 2012-03-05 HISTORY — PX: COLONOSCOPY: SHX5424

## 2012-03-05 HISTORY — PX: ESOPHAGOGASTRODUODENOSCOPY: SHX5428

## 2012-03-05 LAB — BASIC METABOLIC PANEL
Chloride: 105 mEq/L (ref 96–112)
Creatinine, Ser: 0.59 mg/dL (ref 0.50–1.10)
GFR calc Af Amer: >90 mL/min (ref >90)
GFR calc non Af Amer: 87 mL/min — ABNORMAL LOW (ref >90)

## 2012-03-05 LAB — CBC
MCV: 68.4 fL — ABNORMAL LOW (ref 78.0–100.0)
Platelets: 486 10*3/uL — ABNORMAL HIGH (ref 150–400)
WBC: 9.8 10*3/uL (ref 4.0–10.5)

## 2012-03-05 SURGERY — COLONOSCOPY
Anesthesia: Moderate Sedation

## 2012-03-05 SURGERY — EGD (ESOPHAGOGASTRODUODENOSCOPY)
Anesthesia: Moderate Sedation

## 2012-03-05 MED ORDER — FENTANYL CITRATE 0.05 MG/ML IJ SOLN
INTRAMUSCULAR | Status: AC
  Filled 2012-03-05: qty 4

## 2012-03-05 MED ORDER — DIPHENHYDRAMINE HCL 50 MG/ML IJ SOLN
INTRAMUSCULAR | Status: AC
  Filled 2012-03-05: qty 1

## 2012-03-05 MED ORDER — MIDAZOLAM HCL 10 MG/2ML IJ SOLN
INTRAMUSCULAR | Status: DC | PRN
  Administered 2012-03-05: 2 mg via INTRAVENOUS
  Administered 2012-03-05: 1 mg via INTRAVENOUS
  Administered 2012-03-05: 2 mg via INTRAVENOUS

## 2012-03-05 MED ORDER — BUTAMBEN-TETRACAINE-BENZOCAINE 2-2-14 % EX AERO
INHALATION_SPRAY | CUTANEOUS | Status: DC | PRN
  Administered 2012-03-05: 2 via TOPICAL

## 2012-03-05 MED ORDER — FENTANYL CITRATE 0.05 MG/ML IJ SOLN
INTRAMUSCULAR | Status: DC | PRN
  Administered 2012-03-05: 12.5 ug via INTRAVENOUS
  Administered 2012-03-05 (×2): 25 ug via INTRAVENOUS

## 2012-03-05 MED ORDER — MIDAZOLAM HCL 5 MG/ML IJ SOLN
INTRAMUSCULAR | Status: AC
  Filled 2012-03-05: qty 3

## 2012-03-05 NOTE — Op Note (Signed)
[name] [address] [place], [address]   COLONOSCOPY PROCEDURE REPORT  PATIENT: [name], [name]  MR#: [id number] BIRTHDATE: [birth year] , 78  yrs. old GENDER: Female ENDOSCOPIST: Dorena Cookey, MD REFERRED BY: PROCEDURE DATE:  03/05/2012 PROCEDURE: ASA CLASS: INDICATIONS:  anemia, heme positive stool MEDICATIONS:    fentanyl 12.5 mg Versed 1 mg  DESCRIPTION OF PROCEDURE: scope inserted into the rectum and advanced to the rectosigmoid junction where there was sharp angulation and extreme difficulty maintaining position maintaining the lumen in view. I encountered this same problem on A. previous procedure. I could with significant pressure drive the scope into what appeared to be a very narrowed lumen with the patient experiencing discomfort and I did not fill comfortable pushing forward without a clear view of the lumen as opposed to a possible diverticulum. After about 15 minutes if unsuccessful attempt to traverse this area to abort the procedure and pursue barium enema. There were 2 or 3 diverticula noted in the narrowed area otherwise no definite stricture was seen although this could not be ruled out. The rectosigmoid area distal of this area of difficulty was normal as was the rectum. The scope was then withdrawn and the patient returned to the recovery room in stable condition she tolerated the procedure well there were no immediate complications    COMPLICATIONS: None  ENDOSCOPIC IMPRESSION:Limited view to the distal sigmoid at 30 cm, technically difficulty getting beyond unclear whether due to anatomic stricture or simply sharp angulation possibly due to adhesions.  RECOMMENDATIONS:proceed with barium enema based on the severe anemia and heme positive stool.    _______________________________ Rosalie DoctorDorena Cookey, MD 03/05/2012 4:09 PM

## 2012-03-05 NOTE — Progress Notes (Signed)
[facility] Progress Note  Subjective: Patient consumed prep with some coaxing required  Objective: Vital signs in last 24 hours: Temp:  [97.3 F (36.3 C)-98.7 F (37.1 C)] 98.4 F (36.9 C) (01/24 1612) Pulse Rate:  [64-78] 78  (01/24 0928) Resp:  [12-24] 18  (01/24 1605) BP: (119-162)/(47-84) 142/57 mmHg (01/24 1605) SpO2:  [96 %-100 %] 100 % (01/24 1605) Weight change:    PE: Abdomen soft  Lab Results: Results for orders placed during the hospital encounter of 03/02/12 (from the past 24 hour(s))  CBC     Status: Abnormal   Collection Time   03/05/12  6:45 AM      Component Value Range   WBC 9.8  4.0 - 10.5 K/uL   RBC 4.34  3.87 - 5.11 MIL/uL   Hemoglobin 8.9 (*) 12.0 - 15.0 g/dL   HCT 16.1 (*) 09.6 - 04.5 %   MCV 68.4 (*) 78.0 - 100.0 fL   MCH 20.5 (*) 26.0 - 34.0 pg   MCHC 30.0  30.0 - 36.0 g/dL   RDW NOT CALCULATED  40.9 - 15.5 %   Platelets 486 (*) 150 - 400 K/uL  BASIC METABOLIC PANEL     Status: Abnormal   Collection Time   03/05/12  6:45 AM      Component Value Range   Sodium 140  135 - 145 mEq/L   Potassium 4.6  3.5 - 5.1 mEq/L   Chloride 105  96 - 112 mEq/L   CO2 21  19 - 32 mEq/L   Glucose, Bld 74  70 - 99 mg/dL   BUN <3 (*) 6 - 23 mg/dL   Creatinine, Ser 8.11  0.50 - 1.10 mg/dL   Calcium 9.0  8.4 - 91.4 mg/dL   GFR calc non Af Amer 87 (*) >90 mL/min   GFR calc Af Amer >90  >90 mL/min    Studies/Results: EGD showed small hiatal hernia, colonoscopy only successful to 30 cm very similar to previous attempted 2006 requiring barium enema   Assessment: Small hiatal hernia otherwise normal EGD. Unsuccessful colonoscopy with Limited exam to 30 cm due to sharp angulation  Plan: Given the profound anemia and heme-positive stool will go ahead with followup barium enema tomorrow morning.    Kamariyah Timberlake C 03/05/2012, 4:13 PM

## 2012-03-05 NOTE — Progress Notes (Signed)
[facility] PROGRESS NOTE  Courtney SENSENEY RUE:[id number] DOB: 10/29/1933 DOA: 03/02/2012 PCP: Loreen Freud, DO  Assessment/Plan: 1. Severe anemia likely subacute to chronic(has history of iron and B12 deficiency anemia in the past) - stool is guaiac positive although brown, BUN is normal, likely lower GI bleed, she does have history of diverticulosis, denies any history of unintentional weight loss,she appears well compensated, anemia panel, 3 units of packed RBC transfusion, GI Dr. Madilyn Fireman saw the patient today.   Home dose IM and B12 supplementation will be continued. Her H&h is 8.9/29 after the prbc transfusions. She is not actively bleeding. Will continue to monitor. Her anemia panel reveals low ferritin of 3 with normal folate and B12 levels. Colonoscopy today 2.HTN- continue home dose Lopressor,. 3. History of osteoporosis. For now Fosamax will be held, I doubt this upper GI bleed however she has history of GERD, continue calcium vitamin D supplementation.  4. History of GERD. Changed to IV PPI for now, no history of melena, BUN normal, unlikely large acute upper GI bleed.  5. History of dementia but appears to be moderate to advanced, she is pleasantly confused, is at risk for delirium, will observe closely on telemetry, avoid benzodiazepines.  Code Status: full  Family Communication: spoke to her daughter and she wants her mom to get the procedure.  Disposition Plan: PENDING    Consultants: GI  HPI/Subjective:  NO new complaints.   Objective: Filed Vitals:   03/04/12 2115 03/05/12 0603 03/05/12 0912 03/05/12 0928  BP: 158/64 153/54  138/84  Pulse: 70 64  78  Temp: 97.8 F (36.6 C) 97.3 F (36.3 C)  97.3 F (36.3 C)  TempSrc:    Oral  Resp: 20 18  18   SpO2: 98% 99% 97% 99%    Intake/Output Summary (Last 24 hours) at 03/05/12 1032 Last data filed at 03/04/12 1700  Gross per 24 hour  Intake    240 ml  Output      0 ml  Net    240 ml   There were no vitals filed  for this visit.  Exam:   General:  Alert afebrile sitting on the bed chatting  Cardiovascular: s1s2  Respiratory: CTAB  Abdomen: soft NT ND BS+  Data Reviewed: Basic Metabolic Panel:  Lab 03/05/12 1478 03/04/12 1003 03/03/12 0705 03/02/12 1428  NA 140 137 136 135  K 4.6 3.3* 3.5 3.2*  CL 105 102 104 101  CO2 21 23 21 21   GLUCOSE 74 89 82 102*  BUN <3* 3* 3* 6  CREATININE 0.59 0.64 0.60 0.67  CALCIUM 9.0 8.7 8.5 8.5  MG -- -- 2.1 --  PHOS -- -- -- --   Liver Function Tests: No results found for this basename: AST:5,ALT:5,ALKPHOS:5,BILITOT:5,PROT:5,ALBUMIN:5 in the last 168 hours No results found for this basename: LIPASE:5,AMYLASE:5 in the last 168 hours No results found for this basename: AMMONIA:5 in the last 168 hours CBC:  Lab 03/05/12 0645 03/04/12 1003 03/03/12 0705 03/02/12 2141 03/02/12 1605 03/02/12 1428  WBC 9.8 -- 9.0 -- -- 9.8  NEUTROABS -- -- -- -- -- 7.1  HGB 8.9* 8.9* 8.7* 7.1* 4.1* --  HCT 29.7* 29.3* 28.3* 24.5* 16.0* --  MCV 68.4* -- 66.9* -- -- 55.2*  PLT 486* -- 423* -- -- 515*   Cardiac Enzymes:  Lab 03/02/12 1428  CKTOTAL --  CKMB --  CKMBINDEX --  TROPONINI <0.30   BNP (last 3 results) No results found for this basename: PROBNP:3 in the last  8760 hours CBG: No results found for this basename: GLUCAP:5 in the last 168 hours  No results found for this or any previous visit (from the past 240 hour(s)).   Studies: No results found.  Scheduled Meds:    . calcium-vitamin D  1 tablet Oral BID  . cholecalciferol  1,000 Units Oral Daily  . donepezil  10 mg Oral QHS  . ferrous sulfate  325 mg Oral Q breakfast  . metoprolol  50 mg Oral BID  . mometasone-formoterol  2 puff Inhalation BID  . pantoprazole (PROTONIX) IV  40 mg Intravenous Q12H  . sodium chloride  3 mL Intravenous Q12H  . vitamin B-12  1,000 mcg Oral Daily   Continuous Infusions:    . sodium chloride    . sodium chloride    . sodium chloride      Active  Problems:  ANEMIA, IRON DEFICIENCY, MICROCYTIC  ANEMIA, B12 DEFICIENCY  DEMENTIA  HYPERTENSION  HIATAL HERNIA  DIVERTICULOSIS OF COLON        [name]  [facility] Pager [phone]. If 8PM-8AM, please contact night-coverage at [email or web address], password [facility] 03/05/2012, 10:32 AM  LOS: 3 days

## 2012-03-05 NOTE — Progress Notes (Signed)
Physical Therapy Treatment Patient Details Name: [name] MRN: [id number] DOB: 12/26/1933 Today's Date: 03/05/2012 Time: 5621-3086 PT Time Calculation (min): 30 min  PT Assessment / Plan / Recommendation Comments on Treatment Session  Pt admitted with anemia and has been refusing colonoscopy with history of dementia. Pt progressing well with mobility but requires encouragement to initiate and complete tasks as well as cueing for safety. Will continue to follow and continue to recommend supervision for safety at discharge.     Follow Up Recommendations  Supervision for mobility/OOB                                Plan Discharge plan remains appropriate;Frequency remains appropriate    Precautions / Restrictions Precautions Precautions: Fall   Pertinent Vitals/Pain No pain    Mobility  Bed Mobility Bed Mobility: Supine to Sit;Sit to Supine Supine to Sit: 6: Modified independent (Device/Increase time);HOB flat Sit to Supine: 6: Modified independent (Device/Increase time);HOB flat Transfers Sit to Stand: 4: Min guard;From bed;From toilet Stand to Sit: 4: Min guard;To toilet;To bed Details for Transfer Assistance: cueing for hand placement and safety Ambulation/Gait Ambulation/Gait Assistance: 5: Supervision Ambulation Distance (Feet): 500 Feet Assistive device: Rolling walker Ambulation/Gait Assistance Details: cueing for position in RW, posture and directional cues to room. pt continues to demonstrate limp of LLE Gait Pattern: Step-through pattern;Decreased stride length;Decreased hip/knee flexion - left Gait velocity: decreased Stairs: Yes Stairs Assistance: 6: Modified independent (Device/Increase time) Stair Management Technique: Two rails Number of Stairs: 2     Exercises General Exercises - Lower Extremity Long Arc Quad: AROM;Both;20 reps;Seated Hip ABduction/ADduction: AROM;Both;20 reps;Seated Hip Flexion/Marching: AROM;Both;20 reps;Seated   PT  Diagnosis:    PT Problem List:   PT Treatment Interventions:     PT Goals Acute Rehab PT Goals PT Goal: Sit to Stand - Progress: Progressing toward goal PT Goal: Stand to Sit - Progress: Progressing toward goal PT Goal: Ambulate - Progress: Progressing toward goal PT Goal: Up/Down Stairs - Progress: Met  Visit Information  Last PT Received On: 03/05/12 Assistance Needed: +1    Subjective Data  Subjective: "I just want to eat something"- pt receiving bowel prep and no food as of yet today   Cognition  Overall Cognitive Status: Impaired Area of Impairment: Safety/judgement Arousal/Alertness: Awake/alert Orientation Level: Disoriented to;Time Behavior During Session: WFL for tasks performed Cognition - Other Comments: pt unable to state the number 911 in case of emergency. Pt able to solve simple money management issues and able to spell world backward as well as recall 3 words after 3 min. Pt unable to recall room number or find room even after provided number    Balance     End of Session PT - End of Session Equipment Utilized During Treatment: Gait belt Activity Tolerance: Patient tolerated treatment well Patient left: in bed;with call bell/phone within reach;Other (comment) (sitter present ) Nurse Communication: Mobility status   GP     Toney Sang Beth 03/05/2012, 12:21 PM Delaney Meigs, PT [phone]

## 2012-03-05 NOTE — Progress Notes (Signed)
Utilization review completed. Skylar Priest, RN, BSN. 

## 2012-03-05 NOTE — Op Note (Signed)
[name] [address] [place], [address]   ENDOSCOPY PROCEDURE REPORT  PATIENT: Courtney, Weeks  MR#: [id number] BIRTHDATE: Jun 17, 1933 , 78  yrs. old GENDER: Female ENDOSCOPIST:Rayola Everhart Madilyn Fireman, MD REFERRED BY: PROCEDURE DATE:  03/05/2012 PROCEDURE: ASA CLASS: INDICATIONS: anemia MEDICATION:   fentanyl 50 mcg Versed 3 mg TOPICAL ANESTHETIC:  DESCRIPTION OF PROCEDURE:   esophagus: 3 cm hiatal hernia otherwise normal  Stomach, normal  Duodenum: Normal     COMPLICATIONS: None  ENDOSCOPIC IMPRESSION:hiatal hernia  RECOMMENDATIONS:proceed with colonoscopy    _______________________________ eSignedDorena Cookey, MD 03/05/2012 3:52 PM

## 2012-03-06 ENCOUNTER — Inpatient Hospital Stay (HOSPITAL_COMMUNITY): Payer: Medicare Other

## 2012-03-06 DIAGNOSIS — D518 Other vitamin B12 deficiency anemias: Secondary | ICD-10-CM

## 2012-03-06 LAB — HEMOGLOBIN AND HEMATOCRIT, BLOOD: Hemoglobin: 9.3 g/dL — ABNORMAL LOW (ref 12.0–15.0)

## 2012-03-06 LAB — TYPE AND SCREEN
ABO/RH(D): AB POS
Antibody Screen: NEGATIVE
Unit division: 0
Unit division: 0
Unit division: 0

## 2012-03-06 MED ORDER — PANTOPRAZOLE SODIUM 40 MG PO TBEC
40.0000 mg | DELAYED_RELEASE_TABLET | Freq: Every day | ORAL | Status: DC
  Administered 2012-03-06 – 2012-03-07 (×2): 40 mg via ORAL
  Filled 2012-03-06 (×2): qty 1

## 2012-03-06 NOTE — Progress Notes (Signed)
[facility] PROGRESS NOTE  [name]:[id number] DOB: 26-Jan-1934 DOA: 03/02/2012 PCP: Loreen Freud, DO  Assessment/Plan: 1. Severe anemia likely subacute to chronic(has history of iron and B12 deficiency anemia in the past) - stool is guaiac positive although brown, BUN is normal, likely lower GI bleed, she does have history of diverticulosis, denies any history of unintentional weight loss,she appears well compensated, anemia panel, 3 units of packed RBC transfusion, GI Dr. Madilyn Fireman saw the patient today.   Home dose IM and B12 supplementation will be continued. Her H&h is 8.9/29 after the prbc transfusions. She is not actively bleeding. Will continue to monitor. Her anemia panel reveals low ferritin of 3 with normal folate and B12 levels. She underwent EGD showing small hiatal hernia. Colonoscopy was limited due to sharp angulation, it is being followed up with a barium enema today. Her H&H remained stable at 8.9. 2.HTN- continue home dose Lopressor,. 3. History of osteoporosis. For now Fosamax will be held, I doubt this upper GI bleed however she has history of GERD, continue calcium vitamin D supplementation.  4. History of GERD. On po protonix. 5. History of dementia but appears to be moderate to advanced, she is pleasantly confused, is at risk for delirium, will observe closely on telemetry, avoid benzodiazepines.  Code Status: full  Family Communication: spoke to her daughter and she wants her mom to get the procedure.  Disposition Plan: PENDING    Consultants: GI  HPI/Subjective:  NO new complaints.   Objective: Filed Vitals:   03/05/12 2316 03/06/12 0636 03/06/12 0707 03/06/12 0758  BP: 155/62 150/64    Pulse: 66 62    Temp: 97.6 F (36.4 C) 98.9 F (37.2 C)    TempSrc:      Resp: 20 20    Weight:   59.194 kg (130 lb 8 oz)   SpO2: 98% 99%  98%    Intake/Output Summary (Last 24 hours) at 03/06/12 1020 Last data filed at 03/06/12 0900  Gross per 24 hour    Intake    840 ml  Output      0 ml  Net    840 ml   Filed Weights   03/06/12 0707  Weight: 59.194 kg (130 lb 8 oz)    Exam:   General:  Alert afebrile sitting on the bed chatting  Cardiovascular: s1s2  Respiratory: CTAB  Abdomen: soft NT ND BS+  Data Reviewed: Basic Metabolic Panel:  Lab 03/05/12 8119 03/04/12 1003 03/03/12 0705 03/02/12 1428  NA 140 137 136 135  K 4.6 3.3* 3.5 3.2*  CL 105 102 104 101  CO2 21 23 21 21   GLUCOSE 74 89 82 102*  BUN <3* 3* 3* 6  CREATININE 0.59 0.64 0.60 0.67  CALCIUM 9.0 8.7 8.5 8.5  MG -- -- 2.1 --  PHOS -- -- -- --   Liver Function Tests: No results found for this basename: AST:5,ALT:5,ALKPHOS:5,BILITOT:5,PROT:5,ALBUMIN:5 in the last 168 hours No results found for this basename: LIPASE:5,AMYLASE:5 in the last 168 hours No results found for this basename: AMMONIA:5 in the last 168 hours CBC:  Lab 03/05/12 0645 03/04/12 1003 03/03/12 0705 03/02/12 2141 03/02/12 1605 03/02/12 1428  WBC 9.8 -- 9.0 -- -- 9.8  NEUTROABS -- -- -- -- -- 7.1  HGB 8.9* 8.9* 8.7* 7.1* 4.1* --  HCT 29.7* 29.3* 28.3* 24.5* 16.0* --  MCV 68.4* -- 66.9* -- -- 55.2*  PLT 486* -- 423* -- -- 515*   Cardiac Enzymes:  Lab 03/02/12  1428  CKTOTAL --  CKMB --  CKMBINDEX --  TROPONINI <0.30   BNP (last 3 results) No results found for this basename: PROBNP:3 in the last 8760 hours CBG: No results found for this basename: GLUCAP:5 in the last 168 hours  No results found for this or any previous visit (from the past 240 hour(s)).   Studies: No results found.  Scheduled Meds:    . calcium-vitamin D  1 tablet Oral BID  . cholecalciferol  1,000 Units Oral Daily  . donepezil  10 mg Oral QHS  . ferrous sulfate  325 mg Oral Q breakfast  . metoprolol  50 mg Oral BID  . mometasone-formoterol  2 puff Inhalation BID  . pantoprazole (PROTONIX) IV  40 mg Intravenous Q12H  . sodium chloride  3 mL Intravenous Q12H  . vitamin B-12  1,000 mcg Oral Daily    Continuous Infusions:    . sodium chloride      Active Problems:  ANEMIA, IRON DEFICIENCY, MICROCYTIC  ANEMIA, B12 DEFICIENCY  DEMENTIA  HYPERTENSION  HIATAL HERNIA  DIVERTICULOSIS OF COLON        [name]  [facility] Pager [phone]. If 8PM-8AM, please contact night-coverage at [email or web address], password [facility] 03/06/2012, 10:20 AM  LOS: 4 days

## 2012-03-06 NOTE — Progress Notes (Signed)
Patient ID: [name], female   DOB: 1933-07-28, 79 y.o.   MRN: [id number]  Patient sitting in chair. No complaints. Await barium enema today due to incomplete colonoscopy yesterday.

## 2012-03-07 DIAGNOSIS — K5730 Diverticulosis of large intestine without perforation or abscess without bleeding: Secondary | ICD-10-CM

## 2012-03-07 DIAGNOSIS — D518 Other vitamin B12 deficiency anemias: Secondary | ICD-10-CM

## 2012-03-07 MED ORDER — PIPERACILLIN-TAZOBACTAM 3.375 G IVPB
3.3750 g | Freq: Three times a day (TID) | INTRAVENOUS | Status: DC
  Administered 2012-03-07 (×2): 3.375 g via INTRAVENOUS
  Filled 2012-03-07 (×4): qty 50

## 2012-03-07 MED ORDER — METRONIDAZOLE 500 MG PO TABS: 500.0000 mg | ORAL_TABLET | Freq: Three times a day (TID) | ORAL | Status: DC

## 2012-03-07 MED ORDER — ONDANSETRON HCL 4 MG PO TABS: 4.0000 mg | ORAL_TABLET | Freq: Three times a day (TID) | ORAL | Status: AC | PRN

## 2012-03-07 MED ORDER — CIPROFLOXACIN HCL 500 MG PO TABS: 500.0000 mg | ORAL_TABLET | Freq: Two times a day (BID) | ORAL | Status: DC

## 2012-03-07 NOTE — Progress Notes (Signed)
Pt did not sleep well during the night. She kept waking up and asking to go home. Is disoriented except to self.  Is not steady when up and would have fallen without assistance.

## 2012-03-07 NOTE — Discharge Summary (Signed)
Physician Discharge Summary  [name] WUJ:[id number] DOB: 1933-11-17 DOA: 03/02/2012  PCP: Loreen Freud, DO  Admit date: 03/02/2012 Discharge date: 03/07/2012    Recommendations for Outpatient Follow-up:  1. Follow up with Dr Madilyn Fireman as recommended.  Discharge Diagnoses:  Active Problems:  ANEMIA, IRON DEFICIENCY, MICROCYTIC  ANEMIA, B12 DEFICIENCY  DEMENTIA  HYPERTENSION  HIATAL HERNIA  DIVERTICULOSIS OF COLON  Diverticulitis of the sigmoid colon.  Discharge Condition: stable  Diet recommendation: low sodium diet  Filed Weights   03/06/12 0707 03/07/12 0500  Weight: 59.194 kg (130 lb 8 oz) 56.7 kg (125 lb)    History of present illness:   [name] is a 79 y.o. female, who is not a very reliable historian due to dementia, currently lives alone in a townhouse community, is fairly active, has history of hypertension, breast cancer, hiatal hernia, diverticulosis along with dementia who went for her routine followup visit to physician's office Dr. Alroy Dust, during a routine visit she was found to be extremely pale, blood work suggested extreme anemia and she was referred to the ER, in the ER extreme anemia was confirmed, patient had brown stool on exam which was guaiac positive, she herself denies any history of blood in stool or black colored stool, denies any nausea vomiting or abdominal discomfort, denies any chest pain, palpitations, recent weight loss, does say that she has been getting easily fatigued when she walks her dog in the evening.  Agent further denies any history of stomach or colon cancer in her or her family. In the ER workup shows hemoglobin of 4.2, BUN normal, she is well compensated with stable heart rate and blood pressure and is fairly asymptomatic besides generalized fatigue. Likely representing chronic to subacute anemia from likely GI blood loss  Hospital Course:  1. Severe anemia likely subacute to chronic(has history of iron and B12 deficiency  anemia in the past) - stool is guaiac positive although brown, BUN is normal, likely lower GI bleed, she does have history of diverticulosis, denies any history of unintentional weight loss,she appears well compensated, anemia panel, 3 units of packed RBC transfusion, GI Dr. Madilyn Fireman saw the patient and scheduled for colonoscopy and EGD. Home dose IM and B12 supplementation will be continued. Her H&h is 8.9/29 after the prbc transfusions. She is not actively bleeding.  Her anemia panel reveals low ferritin of 3 with normal folate and B12 levels. She underwent EGD showing small hiatal hernia. Colonoscopy was limited due to sharp angulation, it is being followed up with a barium enema today showed diverticulitis of the sigmoid colon. She was started initially on zosyn and sicharged on oral cipro and flagyl to complete the 2 weeks course.   2.HTN- continue home dose Lopressor,.  3. History of osteoporosis. For now Fosamax will be held, I doubt this upper GI bleed however she has history of GERD, continue calcium vitamin D supplementation.  4. History of GERD. On po protonix.  5. History of dementia but appears to be moderate to advanced, she is pleasantly confused, is at risk for delirium, will observed closely on telemetry, avoided benzodiazepines   Consultations:  gastroenterology  Discharge Exam: Filed Vitals:   03/06/12 2242 03/07/12 0500 03/07/12 0710 03/07/12 0727  BP: 156/69  185/66   Pulse: 60  65   Temp: 97.7 F (36.5 C)  97.5 F (36.4 C)   TempSrc:   Axillary   Resp: 18  18   Height:      Weight:  56.7 kg (  125 lb)    SpO2: 100%  98% 99%    General: Alert afebrile sitting on the bed chatting  Cardiovascular: s1s2  Respiratory: CTAB  Abdomen: soft NT ND BS+   Discharge Instructions  Discharge Orders    Future Orders Please Complete By Expires   Diet - low sodium heart healthy      Discharge instructions      Comments:   Follow up with PCP and Gastroenterologist as  recommended.  Please get a cbc at PCP office in one week.   Activity as tolerated - No restrictions          Medication List     As of 03/07/2012 10:35 AM    TAKE these medications         alendronate 70 MG tablet   Commonly known as: FOSAMAX   Take 1 tablet (70 mg total) by mouth every 7 (seven) days. Take with a full glass of water on an empty stomach.      CALTRATE 600+D 600-400 MG-UNIT per tablet   Generic drug: Calcium Carbonate-Vitamin D   Take 1 tablet by mouth 2 (two) times daily.      cholecalciferol 1000 UNITS tablet   Commonly known as: VITAMIN D   Take 1,000 Units by mouth daily.      ciprofloxacin 500 MG tablet   Commonly known as: CIPRO   Take 1 tablet (500 mg total) by mouth 2 (two) times daily.      donepezil 10 MG tablet   Commonly known as: ARICEPT   Take 10 mg by mouth at bedtime.      ferrous sulfate 325 (65 FE) MG tablet   Take 325 mg by mouth daily with breakfast.      Fluticasone-Salmeterol 500-50 MCG/DOSE Aepb   Commonly known as: ADVAIR   Inhale 1 puff into the lungs every 12 (twelve) hours.      metoprolol 50 MG tablet   Commonly known as: LOPRESSOR   Take 1 tablet (50 mg total) by mouth 2 (two) times daily.      metroNIDAZOLE 500 MG tablet   Commonly known as: FLAGYL   Take 1 tablet (500 mg total) by mouth 3 (three) times daily.      omeprazole 20 MG capsule   Commonly known as: PRILOSEC   TAKE ONE CAPSULE BY MOUTH EVERY DAY      ondansetron 4 MG tablet   Commonly known as: ZOFRAN   Take 1 tablet (4 mg total) by mouth every 8 (eight) hours as needed for nausea.      vitamin B-12 1000 MCG tablet   Commonly known as: CYANOCOBALAMIN   Take 1,000 mcg by mouth daily.           Follow-up Information    Follow up with Loreen Freud, DO. In 1 week. (please obtain a CBC in one week at pcp office. )    Contact information:   [address] [address] [place] [address] [phone]       Follow up with  HAYES,JOHN C, MD. Call in 2 weeks.   Contact information:   [address]                         [place] [place] [address] [phone]           The results of significant diagnostics from this hospitalization (including imaging, microbiology, ancillary and laboratory)  are listed below for reference.    Significant Diagnostic Studies: Dg Abd Acute W/chest  03/02/2012  *RADIOLOGY REPORT*  Clinical Data: Cough.  Altered mental status.  ACUTE ABDOMEN SERIES (ABDOMEN 2 VIEW & CHEST 1 VIEW)  Comparison: Single view of the chest 02/03/2011.  Findings: The patient has a small left pleural effusion and basilar airspace disease.  Right lung is clear.  There is cardiomegaly and vascular congestion.  Two views of the abdomen show a normal bowel gas pattern.  No free intraperitoneal air is identified.  Severe degenerative disease about the right hip is noted. Milder degree of left hip osteoarthritis is also present.  IMPRESSION:  1.  Small left effusion and basilar airspace disease which could be due to atelectasis or pneumonia. 2.  Cardiomegaly and pulmonary vascular congestion. 3.  Benign appearing abdomen. 4.  Bilateral hip degenerative disease, much worse on the right, where it is severe.   Original Report Authenticated By: Holley Dexter, M.D.    Dg Colon W/cm - Wo/w Kub  03/06/2012  *RADIOLOGY REPORT*  Clinical Data: Anemia.  Heme positive stool.  Incomplete colonoscopy.  SINGLE COLUMN BARIUM ENEMA  Technique:  Initial scout AP supine and erect abdominal images obtained to insure adequate colon cleansing. The patient was not a candidate for air contrast barium enema due to her physical immobility.  Barium was introduced into the colon in a retrograde fashion and refluxed from the rectum to the cecum. Spot images of the colon followed by overhead radiographs were obtained.  Fluoroscopy time: 1.7 minutes.  Comparison:  None.  Findings:  The scout radiographs show no evidence of  dilated bowel loops or free intraperitoneal air.  Right upper quadrant surgical clips seen from prior cholecystectomy.  A single column barium enema shows retrograde filling of the entire colon, with reflux of contrast into the distal small bowel. Moderate diverticulosis is seen involving both the descending and sigmoid portions of the colon.  A segment of luminal narrowing is seen involving the distal sigmoid colon which has a spiculated appearance. The patient experienced pain during contrast passage through this region.  These features are consistent with acute diverticulitis. There is no evidence of contrast leak or extravasation.  No annular constricting lesion is visualized.  No intraluminal masses or polypoid filling defects are identified.  IMPRESSION:  1.  Acute diverticulitis involving the distal sigmoid colon. 2.  Diverticulosis throughout the descending and sigmoid colon. 3.  No definite evidence of colonic neoplasm or obstruction.   Original Report Authenticated By: Myles Rosenthal, M.D.     Microbiology: No results found for this or any previous visit (from the past 240 hour(s)).   Labs: Basic Metabolic Panel:  Lab 03/05/12 1610 03/04/12 1003 03/03/12 0705 03/02/12 1428  NA 140 137 136 135  K 4.6 3.3* 3.5 3.2*  CL 105 102 104 101  CO2 21 23 21 21   GLUCOSE 74 89 82 102*  BUN <3* 3* 3* 6  CREATININE 0.59 0.64 0.60 0.67  CALCIUM 9.0 8.7 8.5 8.5  MG -- -- 2.1 --  PHOS -- -- -- --   Liver Function Tests: No results found for this basename: AST:5,ALT:5,ALKPHOS:5,BILITOT:5,PROT:5,ALBUMIN:5 in the last 168 hours No results found for this basename: LIPASE:5,AMYLASE:5 in the last 168 hours No results found for this basename: AMMONIA:5 in the last 168 hours CBC:  Lab 03/07/12 0640 03/06/12 1254 03/05/12 0645 03/04/12 1003 03/03/12 0705 03/02/12 1428  WBC -- -- 9.8 -- 9.0 9.8  NEUTROABS -- -- -- -- --  7.1  HGB 9.4* 9.3* 8.9* 8.9* 8.7* --  HCT 31.7* 31.3* 29.7* 29.3* 28.3* --  MCV -- --  68.4* -- 66.9* 55.2*  PLT -- -- 486* -- 423* 515*   Cardiac Enzymes:  Lab 03/02/12 1428  CKTOTAL --  CKMB --  CKMBINDEX --  TROPONINI <0.30   BNP: BNP (last 3 results) No results found for this basename: PROBNP:3 in the last 8760 hours CBG: No results found for this basename: GLUCAP:5 in the last 168 hours     Signed:  Kalayna Noy  [facility] 03/07/2012, 10:35 AM

## 2012-03-07 NOTE — Progress Notes (Signed)
ANTIBIOTIC CONSULT NOTE - INITIAL  Pharmacy Consult for Zosyn Indication: acute diverticulitis  Allergies  Allergen Reactions  . Levofloxacin     REACTION: dizziness    Patient Measurements: Height: 5\' 6"  (167.6 cm) Weight: 125 lb (56.7 kg) IBW/kg (Calculated) : 59.3   Vital Signs: Temp: 97.5 F (36.4 C) (01/26 0710) Temp src: Axillary (01/26 0710) BP: 185/66 mmHg (01/26 0710) Pulse Rate: 65  (01/26 0710) Intake/Output from previous day: 01/25 0701 - 01/26 0700 In: 2920 [P.O.:2920] Out: -  Intake/Output from this shift:    Labs:  Basename 03/07/12 0640 03/06/12 1254 03/05/12 0645 03/04/12 1003  WBC -- -- 9.8 --  HGB 9.4* 9.3* 8.9* --  PLT -- -- 486* --  LABCREA -- -- -- --  CREATININE -- -- 0.59 0.64   Estimated Creatinine Clearance: 53.6 ml/min (by C-G formula based on Cr of 0.59). No results found for this basename: VANCOTROUGH:2,VANCOPEAK:2,VANCORANDOM:2,GENTTROUGH:2,GENTPEAK:2,GENTRANDOM:2,TOBRATROUGH:2,TOBRAPEAK:2,TOBRARND:2,AMIKACINPEAK:2,AMIKACINTROU:2,AMIKACIN:2, in the last 72 hours   Microbiology: No results found for this or any previous visit (from the past 720 hour(s)).  Medical History: Past Medical History  Diagnosis Date  . Allergic rhinitis   . Asthma   . Hypertension   . Chronic rhinitis   . Venous insufficiency   . Hiatal hernia   . IBS (irritable bowel syndrome)   . Diverticulosis of colon   . History of breast cancer   . Low back pain syndrome   . Osteopenia   . Fatigue   . Headache   . Anxiety   . Dementia   . DDD (degenerative disc disease), cervical   . Anemia 02/2012    Medications:  Prescriptions prior to admission  Medication Sig Dispense Refill  . alendronate (FOSAMAX) 70 MG tablet Take 1 tablet (70 mg total) by mouth every 7 (seven) days. Take with a full glass of water on an empty stomach.  4 tablet  PRN  . Calcium Carbonate-Vitamin D (CALTRATE 600+D) 600-400 MG-UNIT per tablet Take 1 tablet by mouth 2 (two) times  daily.        . cholecalciferol (VITAMIN D) 1000 UNITS tablet Take 1,000 Units by mouth daily.        Marland Kitchen donepezil (ARICEPT) 10 MG tablet Take 10 mg by mouth at bedtime.       . ferrous sulfate 325 (65 FE) MG tablet Take 325 mg by mouth daily with breakfast.      . Fluticasone-Salmeterol (ADVAIR) 500-50 MCG/DOSE AEPB Inhale 1 puff into the lungs every 12 (twelve) hours.      . metoprolol (LOPRESSOR) 50 MG tablet Take 1 tablet (50 mg total) by mouth 2 (two) times daily.  60 tablet  11  . omeprazole (PRILOSEC) 20 MG capsule TAKE ONE CAPSULE BY MOUTH EVERY DAY  30 capsule  2  . vitamin B-12 (CYANOCOBALAMIN) 1000 MCG tablet Take 1,000 mcg by mouth daily.       Assessment: 38 y/oF admitted with severe anemia with GI bleed. Now starting Zosyn for acute diverticulitis.  WBC wnl; afebrile.  Renal function: CrCl ~53 ml/min; stable.  Goal of Therapy:  Eradication of infection  Plan:  Zosyn 3.375 g q8h - 4 hour infusion Follow up renal function, clinical course   Doris Cheadle, PharmD Clinical Pharmacist Pager: [phone] Phone: [phone] 03/07/2012 8:32 AM

## 2012-03-07 NOTE — Progress Notes (Signed)
[facility] PROGRESS NOTE  [name] ZOX:[id number] DOB: 1933/03/16 DOA: 03/02/2012 PCP: Loreen Freud, DO  Assessment/Plan: 1. Severe anemia likely subacute to chronic(has history of iron and B12 deficiency anemia in the past) - stool is guaiac positive although brown, BUN is normal, likely lower GI bleed, she does have history of diverticulosis, denies any history of unintentional weight loss,she appears well compensated, anemia panel, 3 units of packed RBC transfusion, GI Dr. Madilyn Fireman saw the patient today.   Home dose IM and B12 supplementation will be continued. Her H&h is 8.9/29 after the prbc transfusions. She is not actively bleeding. Will continue to monitor. Her anemia panel reveals low ferritin of 3 with normal folate and B12 levels. She underwent EGD showing small hiatal hernia. Colonoscopy was limited due to sharp angulation, it was followed up with a barium enema 1/24, showed some diverticulitis of the distal sigmoid colon, there was no evidence of neoplasm or obstruction. Her H&H remained stable between 8 to 9.  2.HTN- continue home dose Lopressor,. 3. History of osteoporosis. For now Fosamax will be held, I doubt this upper GI bleed however she has history of GERD, continue calcium vitamin D supplementation.  4. History of GERD. On po protonix. 5. History of dementia but appears to be moderate to advanced, she is pleasantly confused, is at risk for delirium, will observe closely on telemetry, avoid benzodiazepines. 6. Diverticulitis: of the sigmoid colon: started her on IV zosyn. Spoke to her daughter aboutthe allergy to levaquin, she reports as far as she knows, her mom is not allergic to any antibiotics and she had used levaquin in the past. She will get her dose of zosyn and will be discharged home on oral cipro and flagyl.    Code Status: full  Family Communication: spoke to her daughter and she wants her mom to get the procedure.  Disposition Plan: PENDING     Consultants: GI  HPI/Subjective:  NO new complaints.   Objective: Filed Vitals:   03/06/12 2242 03/07/12 0500 03/07/12 0710 03/07/12 0727  BP: 156/69  185/66   Pulse: 60  65   Temp: 97.7 F (36.5 C)  97.5 F (36.4 C)   TempSrc:   Axillary   Resp: 18  18   Height:      Weight:  56.7 kg (125 lb)    SpO2: 100%  98% 99%    Intake/Output Summary (Last 24 hours) at 03/07/12 0949 Last data filed at 03/06/12 2200  Gross per 24 hour  Intake   2080 ml  Output      0 ml  Net   2080 ml   Filed Weights   03/06/12 0707 03/07/12 0500  Weight: 59.194 kg (130 lb 8 oz) 56.7 kg (125 lb)    Exam:   General:  Alert afebrile sitting on the bed chatting  Cardiovascular: s1s2  Respiratory: CTAB  Abdomen: soft NT ND BS+  Data Reviewed: Basic Metabolic Panel:  Lab 03/05/12 8119 03/04/12 1003 03/03/12 0705 03/02/12 1428  NA 140 137 136 135  K 4.6 3.3* 3.5 3.2*  CL 105 102 104 101  CO2 21 23 21 21   GLUCOSE 74 89 82 102*  BUN <3* 3* 3* 6  CREATININE 0.59 0.64 0.60 0.67  CALCIUM 9.0 8.7 8.5 8.5  MG -- -- 2.1 --  PHOS -- -- -- --   Liver Function Tests: No results found for this basename: AST:5,ALT:5,ALKPHOS:5,BILITOT:5,PROT:5,ALBUMIN:5 in the last 168 hours No results found for this basename: LIPASE:5,AMYLASE:5 in  the last 168 hours No results found for this basename: AMMONIA:5 in the last 168 hours CBC:  Lab 03/07/12 0640 03/06/12 1254 03/05/12 0645 03/04/12 1003 03/03/12 0705 03/02/12 1428  WBC -- -- 9.8 -- 9.0 9.8  NEUTROABS -- -- -- -- -- 7.1  HGB 9.4* 9.3* 8.9* 8.9* 8.7* --  HCT 31.7* 31.3* 29.7* 29.3* 28.3* --  MCV -- -- 68.4* -- 66.9* 55.2*  PLT -- -- 486* -- 423* 515*   Cardiac Enzymes:  Lab 03/02/12 1428  CKTOTAL --  CKMB --  CKMBINDEX --  TROPONINI <0.30   BNP (last 3 results) No results found for this basename: PROBNP:3 in the last 8760 hours CBG: No results found for this basename: GLUCAP:5 in the last 168 hours  No results found for this or  any previous visit (from the past 240 hour(s)).   Studies: Dg Colon W/cm - Wo/w Kub  03/06/2012  *RADIOLOGY REPORT*  Clinical Data: Anemia.  Heme positive stool.  Incomplete colonoscopy.  SINGLE COLUMN BARIUM ENEMA  Technique:  Initial scout AP supine and erect abdominal images obtained to insure adequate colon cleansing. The patient was not a candidate for air contrast barium enema due to her physical immobility.  Barium was introduced into the colon in a retrograde fashion and refluxed from the rectum to the cecum. Spot images of the colon followed by overhead radiographs were obtained.  Fluoroscopy time: 1.7 minutes.  Comparison:  None.  Findings:  The scout radiographs show no evidence of dilated bowel loops or free intraperitoneal air.  Right upper quadrant surgical clips seen from prior cholecystectomy.  A single column barium enema shows retrograde filling of the entire colon, with reflux of contrast into the distal small bowel. Moderate diverticulosis is seen involving both the descending and sigmoid portions of the colon.  A segment of luminal narrowing is seen involving the distal sigmoid colon which has a spiculated appearance. The patient experienced pain during contrast passage through this region.  These features are consistent with acute diverticulitis. There is no evidence of contrast leak or extravasation.  No annular constricting lesion is visualized.  No intraluminal masses or polypoid filling defects are identified.  IMPRESSION:  1.  Acute diverticulitis involving the distal sigmoid colon. 2.  Diverticulosis throughout the descending and sigmoid colon. 3.  No definite evidence of colonic neoplasm or obstruction.   Original Report Authenticated By: Myles Rosenthal, M.D.     Scheduled Meds:    . calcium-vitamin D  1 tablet Oral BID  . cholecalciferol  1,000 Units Oral Daily  . donepezil  10 mg Oral QHS  . ferrous sulfate  325 mg Oral Q breakfast  . metoprolol  50 mg Oral BID  .  mometasone-formoterol  2 puff Inhalation BID  . pantoprazole  40 mg Oral Q0600  . piperacillin-tazobactam (ZOSYN)  IV  3.375 g Intravenous Q8H  . sodium chloride  3 mL Intravenous Q12H  . vitamin B-12  1,000 mcg Oral Daily   Continuous Infusions:    . sodium chloride      Active Problems:  ANEMIA, IRON DEFICIENCY, MICROCYTIC  ANEMIA, B12 DEFICIENCY  DEMENTIA  HYPERTENSION  HIATAL HERNIA  DIVERTICULOSIS OF COLON        Annaleese Guier  [facility] Pager [phone]. If 8PM-8AM, please contact night-coverage at [email or web address], password [facility] 03/07/2012, 9:49 AM  LOS: 5 days

## 2012-03-08 ENCOUNTER — Encounter (HOSPITAL_COMMUNITY): Payer: Self-pay | Admitting: Gastroenterology

## 2012-03-15 ENCOUNTER — Telehealth: Payer: Self-pay | Admitting: Pulmonary Disease

## 2012-03-15 NOTE — Telephone Encounter (Signed)
lmomtcb for kim.

## 2012-03-15 NOTE — Telephone Encounter (Signed)
Spoke with [name].  [name] states pt was d/c'd from [facility] on 03/07/12.  Kim requesting HFU with Dr. Kriste Basque.  I offered TP but they want to see SN.  Would like to know if she can be worked in Friday bc [name] is off.  If not, would like an appt later in the day d/t Kim's work schedule.  Dr. Kriste Basque and Marliss Czar, pls advise if pt can be worked in.  Thank you.

## 2012-03-19 NOTE — Telephone Encounter (Signed)
Leigh pls advise on appt for HFU.

## 2012-03-29 NOTE — Telephone Encounter (Signed)
We can use the 2:30 on Thursday for her HFU appt.  thanks

## 2012-03-29 NOTE — Telephone Encounter (Signed)
Spoke with [name] and scheduled appt with SN for Thurs 04/01/12 at 2:30 for HFU She states nothing further needed

## 2012-04-01 ENCOUNTER — Inpatient Hospital Stay: Payer: Medicare Other | Admitting: Pulmonary Disease

## 2012-04-08 ENCOUNTER — Ambulatory Visit (INDEPENDENT_AMBULATORY_CARE_PROVIDER_SITE_OTHER): Payer: Medicare Other | Admitting: Pulmonary Disease

## 2012-04-08 ENCOUNTER — Other Ambulatory Visit (INDEPENDENT_AMBULATORY_CARE_PROVIDER_SITE_OTHER): Payer: Medicare Other

## 2012-04-08 ENCOUNTER — Encounter: Payer: Self-pay | Admitting: Pulmonary Disease

## 2012-04-08 VITALS — BP 116/70 | HR 94 | Temp 96.9°F | Ht 66.0 in | Wt 116.0 lb

## 2012-04-08 DIAGNOSIS — D508 Other iron deficiency anemias: Secondary | ICD-10-CM

## 2012-04-08 DIAGNOSIS — K5730 Diverticulosis of large intestine without perforation or abscess without bleeding: Secondary | ICD-10-CM

## 2012-04-08 DIAGNOSIS — I10 Essential (primary) hypertension: Secondary | ICD-10-CM

## 2012-04-08 DIAGNOSIS — I872 Venous insufficiency (chronic) (peripheral): Secondary | ICD-10-CM

## 2012-04-08 MED ORDER — DONEPEZIL HCL 10 MG PO TABS: 10.0000 mg | ORAL_TABLET | Freq: Every day | ORAL | Status: AC

## 2012-04-08 MED ORDER — METOPROLOL TARTRATE 50 MG PO TABS: 50.0000 mg | ORAL_TABLET | Freq: Two times a day (BID) | ORAL | Status: DC

## 2012-04-08 MED ORDER — TRAMADOL HCL 50 MG PO TABS: 50.0000 mg | ORAL_TABLET | Freq: Three times a day (TID) | ORAL | Status: DC | PRN

## 2012-04-08 NOTE — Patient Instructions (Addendum)
Today we updated your med list in our EPIC system...    Continue your current medications the same...  Today we did some follow up blood work... We will schedule a CT scan of your abdomen...    We will contact you w/ the results when avail...  Let's plan a follow up appt in 2 months, sooner if needed for problems.Marland KitchenMarland Kitchen

## 2012-04-08 NOTE — Progress Notes (Signed)
Subjective:    Patient ID: MIGUEL MEDAL, female    DOB: [birth year], 79 y.o.   MRN: [id number]  HPI 79 y/o WF here to re-establish care after a >44yr hiatus... she has multiple medical problems as noted below & she also sees DrJHayes for GI, DrRKaplan for GYN, and DrRubin for Breast Cancer.  ~  October 02, 2010:  >60yr ROV & brought in by her daughter [name] (work# [phone], cell# [phone]); there is much interval history gleaned from daughter, [employer] EMR records, [employer] records & TC w/ DrJHayes>    9/11:  Family took her to ER w/ weakness, nausea, FTT at home> eval revealed severe microcytic anemia w/ Hg=5.3, MCV=60, Fe/Tibc weren't done; she was transfused & GI consulted (DrGanem, DrJHayes); stool hemoccult was NEG, PT/INR & LFTs were normal; pt refused EGD/ Colon & was obviously demented & difficult to deal w/ in the Hosp> Psychiatry was called & she was deemed incompetent, [name] declared her Guardian by the Court;  She was stabilized & disch home on Metoprolol, Protonix, Femara, Xanax, B12 tabs...    10/11:  She was seen post hosp by DrLowne just one time> Hg=11.6, MCV=74, Fe=30, TIBC=309, B12=765, VitD=23; she was given a Vit B12 shot & a PNEUMOVAX injection and asked to ret for monthly B12 shots but she never returned...    2/12:  She went to see DrJHayes w/ Hg reported 8.4 & she was relatively asymptomatic; started on Iron, stools negx6; he recommended repeat EGD/ Colon, had it sched several times but she repeatedly cancelled/ refused/ etc; her Hg dropped to 7.7, then 6.3, had 2 of 6 repeat stool cards pos for occult blood, & she agreed to transfusion- done as outpt 6/12...    8/12:  Now daughter brings her back in to see me & I spent several hours piecing together her story >>    Asthma> this has NOT been an issue but oddly enough the Advair is one of the few meds she will take regularly w/o being prompted...    HBP> controlled well on the Metoprolol 50mg Bid.    HH seen on CXR> may  be source of GI blood loss & her iron defic anemia; rec OMEPRAZOLE 20mg /d for now...    Osteopenia & ?compression fx ~L1 on CXR> we will look into this later, for now rec continuing Calcium, MVI, Vit D 1000/d...    Anemia> ?etiology (never determined w/ certainty) but most likely her mod HH seen on CXR (prev EGD/ Colon 2006 showed smHH, divertics w/ stricture, no lesions seen); Hg now 10.5, MCV=73, Fe not done; we will continue RX w/ FESO4 325mg /d & take w/ Vit C 500mg /d; plan careful f/u Hg & could consider Fe infusion if nec...    Vit B12 defic> it was 188 in hosp 9/11 & started on B12 shots immed by the hospitalists; f/u level 765 shortly after disch but she never ret for continued shots; B12 level now 242 & we discussed trial of oral supplementation taking po daily...    Dementia> she is seeing DrPlovsky for Psyche> on Aricept 10mg /d...  ~  November 04, 2010:  79mo ROV & she is feeling sl better just c/o some allergy symptoms; she is getting some exercise walking "[name]" her Cocker spaniel up the hill & back she says...  Breathing is stable on her Advair & Mucinex;  BP is acceptable on Metop50Bid;  She denies GI symptoms on her Omep & stools dark from the Fe;  She continues  to see DrPlovsky on the Aricept;  She notes fair appetite & wt is down 10# to 133# today & asked to start dietary supplements...  We decided to continue same meds and recheck in 3610mo w/ f/u labs...  ~  March 04, 2011:  5mo ROV & Shasha visited the [facility] 12[facility] w/ n/v, palpit, weakness> review of record showed rales in chest, neg abd exam, Wbc=13.7, Hg=12.5, CXR w/ ?LLLpneumonia, CTBrain w/ atrophy/ sm vessel dis/ NAD;  She was treated w/ IVfluids, & given ZPak & Amox at disch, slowly improved, back to baseline...    Today she seems much improved, more jovial & spontaneous, states she is doing well, & daughter confirms, wt stable 132# w/ Ensure supplements...    Asthma/ ?COPD> on Advair500Bid; improved after episode  above 7 back to baseline...    HBP> controlled on Metop50Bid; BP= 142/68 today & daugh monitors her meds at home...    HH> on Prilosec20/d; prev n/v resolved, denies choking episode/ trouble swallowing/ etc...    Divertics/IBS> she has known divertics & narrowing in sigmoid area; reminded to stay on Miralax/ Senakot-S/ etc...    Hx Breast Cancer> followed by DrRubin...    DJD, LBP> known degen lumbar spondylosis etc; part compression fx seen in lower thor vert on CXR...    Osteopenia> due for f/u BMD & consideration of Bisphos therapy again; reminded to take Calcium, MVI, VitD...    Dementia> followed by DrPlovsky on Aricept10 & she has definitely stabilized/ improved...    Anemia> etiology never fully determined but likely related to large HH; on FeSO4 daily  ~  August 28, 2011:  610mo ROV & [place]at is basically stable, c/o some back discomfort today, no known trauma etc & we rec Rest, Heat, Tylenol, Advil, etc... She had BMD rechecked 2/13 w/ TScore -2.9 in left FemNeck & FOSAMAX 70mg /wk restarted... She is eating better & weight up 5# to 137# today...    We reviewed prob list, meds, xrays and labs> see below for updates >>  ~  March 02, 2012:  610mo ROV & Shaton is incredibly pale and dyspneic w/ min activity; brought in by her daughter who says she didn't notice color change but mom has been c/o nausea & not eating well;  Pt denies abd pain or any pain really, she is nauseated but no vomiting; pt states that BMs are normal consistency, brown (not black) & no red blood seen;  Chart indicates 14# wt loss in 610mo to 122# today (daugh states she's not eating);  Hemodynamically stable- BP 100/60 sitting, pulse= 70 regular, RR= 20 & not labored at rest but incr dyspnea walking to bathroom...  Last labs in [facility] showed Hg=12.5 ~1 year ago...  DrJHayes, [facility] is her gastroenterologist- SEE BELOW for details: she had EGD & Colon/BE in 2006 and she refused f/u studies during a similar presentation in 2011    We  reviewed prob list, meds, xrays and labs>> I discussed case w/ ED physician & we will send to [facility] for admission by [facility]...  ~  April 08, 2012:  67mo ROV & post hosp check> Kymberlee was adm 1/21 - 03/07/12 by [facility] w/ severe Fe defic anemia (Hg=4.2, MCV=55, Fe<10) yet she denied abd pain & did not note any change in her stools (brown- weakly heme pos); she was transfused 3u & continued on her oral Fe (no IV Iron was used)- Hg at disch was 9.4;  Eval in the hosp included work up by [employer]  for GI> EGD showed 3cmHH, otherw normal;  Colonoscopy could not advance past the rectosigm juction due to sharp angulation & narrowed segment beyond that;  Subsequent BE showed a segment of luminal narrowing in the distal sigmoid c/w acute diverticulitis- no leak or extravasation, no annular constricting lesion, no luminal masses or polypoid filling defects, mod diverticulosis seen in desc & sigmoid colon;  She was treated w/ antibiotics (IV Zosyn, then PO Cipro/Flagyl) and improved;  She is to f/u w/ DrMagod- we discussed checking LABS today & sched a CTAbd&Pelvis... LABS 2/14:  Chems- ok w/ BS=120, K=3.5, BUN/Creat=normal;  CBC- improved w/ Hg=11.9, MCV=74, Fe=30 (12%)... CT Abd&Pelvis => pending          Current Problems:   << ?ALLERGIC reaction to CIPRO in the past w/ hives, but she took cipro w/o problem 1/14 diverticulitis episode >>  ALLERGIC RHINITIS (ICD-477.9) - prev allergy eval yrs ago by DrESL & on shots for awhile. CHRONIC RHINITIS (ICD-472.0) - prev ENT eval 2005 by DrRosen for recurrent sinus infections, pressure in her head & HA's... he wanted to do Sinus CT but she declined... Rx w/ antihist/ saline/ Nasonex...  ASTHMA (ICD-493.90) - controlled on ADVAIR500 Bid; she no longer has a rescue inhaler; denies breathing difficulty. Cough, sputum, hemoptysis, etc... ~  CXR 5/07 (pre-op for breast surg) showed mild chronic changes, scarring left base, NAD.Marland Kitchen. ~  CXR 8/12 showed mod HH seen behind the heart,  mild scarring vs atx left base, wedging of lower TSpine vert body... ~  12/12: went to ER w/ ?n/v/weakness; CXR- ?LLLinfiltrate vs scarring/atx; improved w/ ZPak & Amox... ~  1/14: single view CXR showed cardiomeg & vasc congestion, sm left effusion & basilar airsp dis...  HYPERTENSION (ICD-401.9) - controlled on LOPRESSOR 50mg  Bid... she was seen by [facility] in the 1990's... ~  2DEcho 4/91 showed mild MVP w/ myxomatous ant leaflet & mild MR... ~  Myocardial Perfusion Scan 5/95 was WNL showing normal contractility & uptake in all myocardial segments. ~  1/13:  BP= 142/68 & she states asymptomatic w/o CP, palpit, SOB, edema, etc... ~  7/13:  BP= 126/64 & doing well overall she says... ~  EKG 1/14 showed NSR, rate69, NSSTTWA, NAD... ~  2/14:  BP= 116/70 & she is feeling better- denies CP, palpit, ch in SOB, edema, etc...  SUPRAVENTRICULAR TACHYCARDIA >> she developed a transient SVT 9/11 during her Hosp for anemia; subseq EKG showed NSR w/ PACs & minor NSSTTWA. ~  1/14:  EKG & monitor during hosp for diverticulitis & anemia showed NSR, rate69, NSSTTWA, NAD...   VENOUS INSUFFICIENCY (ICD-459.81) - she knows to avoid sodium, elevate legs, wear support hose when able...  HIATAL HERNIA (ICD-553.3) - she uses PRILOSEC 20mg /d...  ~  Old films showed RUQ clips from prior cholecystectomy... ~  EGD 11/06 by DrJHayes showed sm HH, otherw normal, no evid of Barrett's... ~  CXR 8/12 showed ?mod sized HH behind the heart; ?source of her iron defic anemia? (only 2/18 stool cards pos for occult blood over the past yr). ~  1/14:  EGD by DrMagod (part of w/u for iron defic anemia) showed 3cmHH otherw neg...  IRRITABLE BOWEL SYNDROME (ICD-564.1) DIVERTICULOSIS OF COLON (ICD-562.10) - reminded to use Miralax/ Senakot-S as needed... ~  11/05: Hosp w/ diverticulitis & abscess> Rx'd w/ antibiotics & improved; DrStreck considered sigmoid colectomy but she improved w/ high fiber diet etc... ~  Colonoscopy 11/06 by  [facility] showed divertic stricture in the rectosigmoid junction, scope passed only  to the mid transverse colon...  ~  11/06: subseq BE showed innumerable divertics, some narrowing in the sigmoid region, otherw neg... ~  1/14:  Hosp for severe iron defic anemia & Colonoscopy by DrMagod could not advance past the rectosigm juction due to sharp angulation & narrowed segment beyond that;  Subsequent BE showed a segment of luminal narrowing in the distal sigmoid c/w acute diverticulitis- no leak or extravasation, no annular constricting lesion, no luminal masses or polypoid filling defects, mod diverticulosis seen in desc & sigmoid colon;  She was treated w/ antibiotics (IV Zosyn, then PO Cipro/Flagyl) and improved; We decided to sched a CTAbd&Pelvis=> pending  Hx of BREAST CANCER (ICD-174.9) - off ARIMEDEX per DrRubin... she had an abn mammogram in 2007 w/ subseq right lumpectomy & sentinel node bx by DrStreck pos for invasive ductal cancer- stage I... post op XRT from DrWu, and followed by DrRubin ever since then...   DEGENERATIVE ARTHRITIS >> ~  XRay of right hip 9/07 showed marked degenerative changes w/ narrowing, sclerosis, spur formation... ~  1/14: Abd film on adm for anemia/ diverticulitis showed severe degen changes in right hip, minor changes in left hip...  LOW BACK PAIN SYNDROME (ICD-724.2) - she had lumbar surgery in 1989 by DrAplington w/ recurrent back and leg pain in 2006 & was evaluated by DrAplington & DrNudelman- he rec conservative management as the required surgery would be quite extensive... she saw DrKirshmayer in the pain management cliic as well... ~  MRI Lumbar Spine 9/07 showed degenerative lumbar spondylosis w/ assoc DDD & degen facet arthritis; multilevel spinal, lat recess, & foraminal stenoses at various levels... ~  CXR reports wedge deformity of lower thoracic vert, she denies any acute pain...  OSTEOPENIA (ICD-733.90) -  DrRubin had her on ALENDRONATE weekly + caltrate &  MVI> but this was stopped ?when ?why... ~  BMD at [facility] by DrRubin 7/07 showed osteopenia w/ TScores -1.5 in Spine, & -1.3 in right hip... ~  2/13: f/u BMD==> TScores -1.2 in Spine, and -2.9 in left [facility]; she is rec to restart the FOSAMAX 70mg  Qwk...  DEMENTIA >> on ARICEPT 10mg /d... ~  CT Brain 9/11 by the hospitalists showed generalized atrophy, chronic small vessel disease, NAD (no mets), chronic sinusitis... ~  CT Brain 1/12 by DrPlovsky showed some atrophy, small vessel ischemic disease, no acute infarcts; there was also evid of extensive chronic sinusitis ~  1/13: she seems better- brighter, more spontaneous, etc...  ANEMIA >> Fe-deficient anemia on FeSO4 325mg /d... ~  [facility] 9/11 w/ severe microcytic anemia ?etiology; Hg=5.3, MCV=60; Fe/Tibc weren't done; transfused ?units of ABpos blood & final Hg=11.4, MCV=71... ~  In the [facility] 9/11 her B12 level was 188 & she was given B12 shots w/o further eval; B12 level improved to 765 w/ the shots... ~  She saw DrLowne in the office 10/11 & was sched for B12 shots to start but she never returned... ~  Starting in 2/12 she was re-eval by [facility] w/ Hg=8.4, she no showed/ cancelled/ refused several attempts to sched EGD/ Colon & Hg dropped to 7.7 then 6.3; she was transfused as outpt then lost to f/u until her 8/12 OV w/ here... ~  Labs 8/12 showed Hg=10.5, MCV=73, Fe=not done> we decided on FeSO4 325mg /d w/ Vit C 500mg /d ~  Labs 12/12 showed Hg= 12.5, MCV=85, & she remains on Fe daily... ~  Presented 1/14 w/ extreme pallor, weakness, dyspnea; Hg=4.2, Fe<10, adm by [facility]=> transfused, oral Iron supplementation, & Hg improved to  9.4 at disch. ~  2/14: pt returns for post hosp visit> feeling better s/p treatment for diverticulitis & transfusion for anemia; labs today showed Hg= 11.9, MCV=74, Fe=30  VITAMIN B12 Deficiency >> asked to take B12 OTC oral supplement ~  Labs in hosp 9/11 showed Vit B12 level = 188; she was immed started on shots by  the hospitalists; f/u B12 level 10/11 was 765 & DrLowne planned monthly shots but she never showed up... ~  Labs here 8/12 showed B12 level = 242 and we decided to supplement orally daily for now... ~  Labs 1/14 in hosp showed B12 level = 628; Rec to continue oral supplement...   Past Surgical History  Procedure Laterality Date  . Appendectomy  1950;s  . Right inguinal hernia repair    . Lumbar laminectomy  1989    Dr, Simonne Come  . Laparoscopic cholecystectomy  1992    Dr. Orson Slick  . Right breast lumpectomy/sentinel node bx  06/2005    Dr. Jamey Ripa  . Colonoscopy  03/05/2012    Procedure: COLONOSCOPY;  Surgeon: Barrie Folk, MD;  Location: [facility] ENDOSCOPY;  Service: Endoscopy;  Laterality: N/A;  . Esophagogastroduodenoscopy  03/05/2012    Procedure: ESOPHAGOGASTRODUODENOSCOPY (EGD);  Surgeon: Barrie Folk, MD;  Location: [facility] ENDOSCOPY;  Service: Endoscopy;  Laterality: N/A;    Outpatient Encounter Prescriptions as of 04/08/2012  Medication Sig Dispense Refill  . Calcium Carbonate-Vitamin D (CALTRATE 600+D) 600-400 MG-UNIT per tablet Take 1 tablet by mouth 2 (two) times daily.        . cholecalciferol (VITAMIN D) 1000 UNITS tablet Take 1,000 Units by mouth daily.        Marland Kitchen donepezil (ARICEPT) 10 MG tablet Take 10 mg by mouth at bedtime.       . ferrous sulfate 325 (65 FE) MG tablet Take 325 mg by mouth daily with breakfast.      . Fluticasone-Salmeterol (ADVAIR) 500-50 MCG/DOSE AEPB Inhale 1 puff into the lungs every 12 (twelve) hours.      . metoprolol (LOPRESSOR) 50 MG tablet Take 1 tablet (50 mg total) by mouth 2 (two) times daily.  60 tablet  11  . omeprazole (PRILOSEC) 20 MG capsule TAKE ONE CAPSULE BY MOUTH EVERY DAY  30 capsule  2  . ondansetron (ZOFRAN) 4 MG tablet Take 1 tablet (4 mg total) by mouth every 8 (eight) hours as needed for nausea.  20 tablet  0  . vitamin B-12 (CYANOCOBALAMIN) 1000 MCG tablet Take 1,000 mcg by mouth daily.      . [DISCONTINUED] metoprolol (LOPRESSOR)  50 MG tablet Take 1 tablet (50 mg total) by mouth 2 (two) times daily.  60 tablet  11  . traMADol (ULTRAM) 50 MG tablet Take 1 tablet (50 mg total) by mouth 3 (three) times daily as needed for pain.  90 tablet  5  . [DISCONTINUED] ciprofloxacin (CIPRO) 500 MG tablet Take 1 tablet (500 mg total) by mouth 2 (two) times daily.  30 tablet  0  . [DISCONTINUED] metroNIDAZOLE (FLAGYL) 500 MG tablet Take 1 tablet (500 mg total) by mouth 3 (three) times daily.  42 tablet  0   No facility-administered encounter medications on file as of 04/08/2012.    Allergies  Allergen Reactions  . Levofloxacin     REACTION: dizziness    Current Medications, Allergies, Past Medical History, Past Surgical History, Family History, and Social History were reviewed in [employer] record.   Review of Systems  See HPI - all other systems neg except as noted...       The patient complains of some weakness, & dyspnea on exertion.  The patient denies anorexia, fever, weight loss, weight gain, vision loss, decreased hearing, hoarseness, chest pain, syncope, peripheral edema, prolonged cough, headaches, hemoptysis, abdominal pain, melena, hematochezia, severe indigestion/heartburn, hematuria, incontinence, muscle weakness, suspicious skin lesions, transient blindness, difficulty walking, depression, unusual weight change, abnormal bleeding, enlarged lymph nodes, and angioedema.   Objective:   Physical Exam    WD, thin & sl pale, 79 y/o WF in NAD... she is chronically ill appearing. GENERAL:  Alert & mostly cooperative, obvious dementia/ some confusion. HEENT:  [place]/AT, EOM-wnl, PERRLA, EACs-clear, TMs-wnl, NOSE- sl congested, THROAT-clear & wnl. NECK:  Supple w/ fairROM; no JVD; normal carotid impulses w/o bruits; no thyromegaly or nodules palpated; no lymphadenopathy. CHEST:  Clear to P & A; without wheezes or rales- few rhonchi scattered bilat... HEART:  Regular Rhythm; without murmurs/ rubs/  or gallops heard... ABDOMEN:  Soft & nontender; normal bowel sounds; no organomegaly or masses detected. EXT: without deformities, mild arthritic changes; no varicose veins/ +venous insuffic/ no edema. NEURO:  CN's intact; motor testing normal; sensory testing normal; gait normal & balance OK. DERM:  No lesions noted; no rash etc...  MMSE 10/02/10 >> Date 2012 Aug ?day; she didn't know the Pres/ VP/ Gov/ or prev administration; remembered 3/3 objects immed but 0/3 after distraction; serial 7's= 100-93-86-7?; WORLD backwards= DLROW; proverbs were mixed- one abstracted (glass houses), others concrete (stitch, etc)...   RADIOLOGY DATA:  Reviewed in the [facility] EMR & discussed w/ the patient & daughter...  LABORATORY DATA:  Reviewed in the [facility] EMR & discussed w/ the patient & daughter...   Assessment & Plan:    Asthma> this has NOT been an issue but she has been maintained on Advair, Mucinex...  HBP> BP controlled well on the Metoprolol 50mg Bid...  HH seen on CXR & EGD 1/14> may be source of GI blood loss & her iron defic anemia; on OMEPRAZOLE 20mg /d at present...  IBS, Divertics w/ stricture in sigmoid>  See 2005 hosp for diverticulitis, then subseq work up by [facility] in 2006;  Subseq adm 1/14 for severe IDA & eval including Colonoscopy attempt & BE showed diverticulitis in sigmoid w/ narrowed segment etc;  Clinically improved w/ Cipro/Flagyl Rx;  We are planning CT Abd&Pelvis...  BREAST CANCER>  Prev followed by Celesta Aver, she has been off her Arimidex & I don't have any recent notes from him to review...  Osteopenia & ?compression fx ~L1 on CXR> back on Alendronate70/wk, (f/u BMD 2/13 w/ TScore -2.9 in left FemNeck & Alendronate restarted)... She also has severe degen arthritis in right hip & we discussed trial TRAMADOL + TYLENOL for the pain...  Hx Anemia> ?etiology (never determined w/ certainty) but most likely her mod HH seen on CXR (prev EGD/ Colon 2006 showed smHH, divertics w/  stricture, no lesions seen); Hg 12/12= 12.5, MCV=85==> continue the Fe daily...   1/14> presented for routine 56mo ROV very pale, w/ weakly heme pos stool => Hg=4.2 & sent to ER for Admit; no active bleeding seen- she was transfused, contin Fe... 2/14> office f/u w/ labs showing Hg=   Vit B12 defic> it was 188 in hosp 9/11 & started on B12 shots immed by the hospitalists; f/u level 765 shortly after disch but she never ret for continued shots; B12 level now 242 & we discussed trial of oral supplementation taking po  daily...  Dementia> she is seeing DrPlovsky for Psyche> on Aricept 10mg /d...  Other medical problems as noted> aske to continue Calcium, Women's MVI, Vit D 1000u daily & B-12 1035mcg/d oral supplements...   Patient's Medications  New Prescriptions   TRAMADOL (ULTRAM) 50 MG TABLET    Take 1 tablet (50 mg total) by mouth 3 (three) times daily as needed for pain.  Previous Medications   CALCIUM CARBONATE-VITAMIN D (CALTRATE 600+D) 600-400 MG-UNIT PER TABLET    Take 1 tablet by mouth 2 (two) times daily.     CHOLECALCIFEROL (VITAMIN D) 1000 UNITS TABLET    Take 1,000 Units by mouth daily.     FERROUS SULFATE 325 (65 FE) MG TABLET    Take 325 mg by mouth daily with breakfast.   FLUTICASONE-SALMETEROL (ADVAIR) 500-50 MCG/DOSE AEPB    Inhale 1 puff into the lungs every 12 (twelve) hours.   OMEPRAZOLE (PRILOSEC) 20 MG CAPSULE    TAKE ONE CAPSULE BY MOUTH EVERY DAY   ONDANSETRON (ZOFRAN) 4 MG TABLET    Take 1 tablet (4 mg total) by mouth every 8 (eight) hours as needed for nausea.   VITAMIN B-12 (CYANOCOBALAMIN) 1000 MCG TABLET    Take 1,000 mcg by mouth daily.  Modified Medications   Modified Medication Previous Medication   DONEPEZIL (ARICEPT) 10 MG TABLET donepezil (ARICEPT) 10 MG tablet      Take 1 tablet (10 mg total) by mouth at bedtime.    Take 10 mg by mouth at bedtime.    METOPROLOL (LOPRESSOR) 50 MG TABLET metoprolol (LOPRESSOR) 50 MG tablet      Take 1 tablet (50 mg  total) by mouth 2 (two) times daily.    Take 1 tablet (50 mg total) by mouth 2 (two) times daily.  Discontinued Medications   CIPROFLOXACIN (CIPRO) 500 MG TABLET    Take 1 tablet (500 mg total) by mouth 2 (two) times daily.   METRONIDAZOLE (FLAGYL) 500 MG TABLET    Take 1 tablet (500 mg total) by mouth 3 (three) times daily.

## 2012-04-09 LAB — CBC WITH DIFFERENTIAL/PLATELET
Basophils Absolute: 0 10*3/uL (ref 0.0–0.1)
Eosinophils Absolute: 0.3 10*3/uL (ref 0.0–0.7)
Lymphocytes Relative: 25 % (ref 12.0–46.0)
MCHC: 31.8 g/dL (ref 30.0–36.0)
Neutrophils Relative %: 63 % (ref 43.0–77.0)
RDW: 36.1 % — ABNORMAL HIGH (ref 11.5–14.6)

## 2012-04-09 LAB — IBC PANEL
Saturation Ratios: 12.2 % — ABNORMAL LOW (ref 20.0–50.0)
Transferrin: 176.2 mg/dL — ABNORMAL LOW (ref 212.0–360.0)

## 2012-04-09 LAB — BASIC METABOLIC PANEL
CO2: 28 mEq/L (ref 19–32)
Calcium: 8.8 mg/dL (ref 8.4–10.5)
Creatinine, Ser: 0.7 mg/dL (ref 0.4–1.2)
Glucose, Bld: 120 mg/dL — ABNORMAL HIGH (ref 70–99)

## 2012-04-13 ENCOUNTER — Other Ambulatory Visit: Payer: Medicare Other

## 2012-04-14 ENCOUNTER — Other Ambulatory Visit: Payer: Self-pay | Admitting: Pulmonary Disease

## 2012-04-21 ENCOUNTER — Ambulatory Visit (INDEPENDENT_AMBULATORY_CARE_PROVIDER_SITE_OTHER)
Admission: RE | Admit: 2012-04-21 | Discharge: 2012-04-21 | Disposition: A | Discharge status: Home / Self Care | Payer: Medicare Other | Source: Ambulatory Visit | Attending: Pulmonary Disease | Admitting: Pulmonary Disease

## 2012-04-21 MED ORDER — IOHEXOL 300 MG/ML  SOLN
100.0000 mL | Freq: Once | INTRAMUSCULAR | Status: AC | PRN
  Administered 2012-04-21: 100 mL via INTRAVENOUS

## 2012-06-07 ENCOUNTER — Ambulatory Visit (INDEPENDENT_AMBULATORY_CARE_PROVIDER_SITE_OTHER): Payer: Medicare Other | Admitting: Pulmonary Disease

## 2012-06-07 ENCOUNTER — Encounter: Payer: Self-pay | Admitting: Pulmonary Disease

## 2012-06-07 ENCOUNTER — Other Ambulatory Visit (INDEPENDENT_AMBULATORY_CARE_PROVIDER_SITE_OTHER): Payer: Medicare Other

## 2012-06-07 VITALS — BP 112/60 | HR 72 | Temp 98.0°F | Ht 66.0 in

## 2012-06-07 DIAGNOSIS — K449 Diaphragmatic hernia without obstruction or gangrene: Secondary | ICD-10-CM

## 2012-06-07 DIAGNOSIS — K589 Irritable bowel syndrome without diarrhea: Secondary | ICD-10-CM

## 2012-06-07 DIAGNOSIS — C50919 Malignant neoplasm of unspecified site of unspecified female breast: Secondary | ICD-10-CM

## 2012-06-07 DIAGNOSIS — M1990 Unspecified osteoarthritis, unspecified site: Secondary | ICD-10-CM

## 2012-06-07 DIAGNOSIS — F068 Other specified mental disorders due to known physiological condition: Secondary | ICD-10-CM

## 2012-06-07 DIAGNOSIS — M949 Disorder of cartilage, unspecified: Secondary | ICD-10-CM

## 2012-06-07 DIAGNOSIS — F411 Generalized anxiety disorder: Secondary | ICD-10-CM

## 2012-06-07 DIAGNOSIS — M545 Low back pain, unspecified: Secondary | ICD-10-CM

## 2012-06-07 DIAGNOSIS — J45909 Unspecified asthma, uncomplicated: Secondary | ICD-10-CM

## 2012-06-07 DIAGNOSIS — I10 Essential (primary) hypertension: Secondary | ICD-10-CM

## 2012-06-07 DIAGNOSIS — I872 Venous insufficiency (chronic) (peripheral): Secondary | ICD-10-CM

## 2012-06-07 DIAGNOSIS — M899 Disorder of bone, unspecified: Secondary | ICD-10-CM

## 2012-06-07 DIAGNOSIS — D518 Other vitamin B12 deficiency anemias: Secondary | ICD-10-CM

## 2012-06-07 DIAGNOSIS — D508 Other iron deficiency anemias: Secondary | ICD-10-CM

## 2012-06-07 DIAGNOSIS — K5730 Diverticulosis of large intestine without perforation or abscess without bleeding: Secondary | ICD-10-CM

## 2012-06-07 DIAGNOSIS — J309 Allergic rhinitis, unspecified: Secondary | ICD-10-CM

## 2012-06-07 LAB — IBC PANEL
Iron: 15 ug/dL — ABNORMAL LOW (ref 42–145)
Saturation Ratios: 4.6 % — ABNORMAL LOW (ref 20.0–50.0)
Transferrin: 232.7 mg/dL (ref 212.0–360.0)

## 2012-06-07 LAB — CBC WITH DIFFERENTIAL/PLATELET
Basophils Relative: 2.5 % (ref 0.0–3.0)
Eosinophils Relative: 1.4 % (ref 0.0–5.0)
Lymphocytes Relative: 14.4 % (ref 12.0–46.0)
Monocytes Absolute: 0.9 10*3/uL (ref 0.1–1.0)
Neutrophils Relative %: 73.5 % (ref 43.0–77.0)
Platelets: 602 10*3/uL — ABNORMAL HIGH (ref 150.0–400.0)
RBC: 4.17 Mil/uL (ref 3.87–5.11)
WBC: 10.9 10*3/uL — ABNORMAL HIGH (ref 4.5–10.5)

## 2012-06-07 LAB — BASIC METABOLIC PANEL
BUN: 10 mg/dL (ref 6–23)
CO2: 28 mEq/L (ref 19–32)
Chloride: 100 mEq/L (ref 96–112)
Creatinine, Ser: 0.6 mg/dL (ref 0.4–1.2)
Glucose, Bld: 117 mg/dL — ABNORMAL HIGH (ref 70–99)
Potassium: 4.1 mEq/L (ref 3.5–5.1)

## 2012-06-07 LAB — HEPATIC FUNCTION PANEL
ALT: 11 U/L (ref 0–35)
AST: 16 U/L (ref 0–37)
Albumin: 2.8 g/dL — ABNORMAL LOW (ref 3.5–5.2)
Total Protein: 7.3 g/dL (ref 6.0–8.3)

## 2012-06-07 NOTE — Progress Notes (Signed)
Subjective:    Patient ID: [name], female    DOB: Jun 17, 1933, 79 y.o.   MRN: [id number]  HPI 79 y/o WF w/ multiple medical problems as noted below & she also sees DrJHayes/Magod for GI, DrRKaplan for GYN, and DrRubin for Breast Cancer...  ~  November 04, 2010:  385mo ROV & she is feeling sl better just c/o some allergy symptoms; she is getting some exercise walking "[name]" her [employer] up the hill & back she says...  Breathing is stable on her Advair & Mucinex;  BP is acceptable on Metop50Bid;  She denies GI symptoms on her Omep & stools dark from the Fe;  She continues to see DrPlovsky on the Aricept;  She notes fair appetite & wt is down 10# to 133# today & asked to start dietary supplements...  We decided to continue same meds and recheck in 85mo w/ f/u labs...  ~  March 04, 2011:  858mo ROV & [name] visited the [facility] 12/24 w/ n/v, palpit, weakness> review of record showed rales in chest, neg abd exam, wbc=13.7, Hg=12.5, CXR w/ ?LLLpneumonia, CTBrain w/ atrophy/ sm vessel dis/ NAD;  She was treated w/ IVfluids, & given ZPak & Amox at disch, slowly improved, back to baseline...    Today she seems much improved, more jovial & spontaneous, states she is doing well, & daughter confirms, wt stable 132# w/ Ensure supplements...    Asthma/ ?COPD> on Advair500Bid; improved after episode above 7 back to baseline...    HBP> controlled on Metop50Bid; BP= 142/68 today & daugh monitors her meds at home...    HH> on Prilosec20/d; prev n/v resolved, denies choking episode/ trouble swallowing/ etc...    Divertics/IBS> she has known divertics & narrowing in sigmoid area; reminded to stay on Miralax/ Senakot-S/ etc...    Hx Breast Cancer> followed by DrRubin...    DJD, LBP> known degen lumbar spondylosis etc; part compression fx seen in lower thor vert on CXR...    Osteopenia> due for f/u BMD & consideration of Bisphos therapy again; reminded to take Calcium, MVI, VitD...    Dementia>  followed by DrPlovsky on Aricept10 & she has definitely stabilized/ improved...    Anemia> etiology never fully determined but likely related to large HH; on FeSO4 daily  ~  August 28, 2011:  58mo ROV & [name] is basically stable, c/o some back discomfort today, no known trauma etc & we rec Rest, Heat, Tylenol, Advil, etc... She had BMD rechecked 2/13 w/ TScore -2.9 in left FemNeck & FOSAMAX 70mg /wk restarted... She is eating better & weight up 5# to 137# today...    We reviewed prob list, meds, xrays and labs> see below for updates >>  ~  March 02, 2012:  58mo ROV & [name] is incredibly pale and dyspneic w/ min activity; brought in by her daughter who says she didn't notice color change but mom has been c/o nausea & not eating well;  Pt denies abd pain or any pain really, she is nauseated but no vomiting; pt states that BMs are normal consistency, brown (not black) & no red blood seen;  Chart indicates 14# wt loss in 58mo to 122# today (daugh states she's not eating);  Hemodynamically stable- BP 100/60 sitting, pulse= 70 regular, RR= 20 & not labored at rest but incr dyspnea walking to bathroom...  Last labs in [facility] showed Hg=12.5 ~1 year ago...  DrJHayes, [facility] is her gastroenterologist- SEE BELOW for details: she had EGD & Colon/BE in 2006  and she refused f/u studies during a similar presentation in 2011    We reviewed prob list, meds, xrays and labs>> I discussed case w/ ED physician & we will send to [facility] ER for admission by [facility]...  ~  April 08, 2012:  36mo ROV & post hosp check> [name] was adm 1/21 - 03/07/12 by [facility] w/ severe Fe defic anemia (Hg=4.2, MCV=55, Fe<10) yet she denied abd pain & did not note any change in her stools (brown- weakly heme pos); she was transfused 3u & continued on her oral Fe (no IV Iron was used)- Hg at disch was 9.4;  Eval in the hosp included work up by [employer] for GI> EGD showed 3cmHH, otherw normal;  Colonoscopy could not advance past the rectosigm juction due to sharp  angulation & narrowed segment beyond that;  Subsequent BE showed a segment of luminal narrowing in the distal sigmoid c/w acute diverticulitis- no leak or extravasation, no annular constricting lesion, no luminal masses or polypoid filling defects, mod diverticulosis seen in desc & sigmoid colon;  She was treated w/ antibiotics (IV Zosyn, then PO Cipro/Flagyl) and improved;  She is to f/u w/ DrMagod- we discussed checking LABS today & sched a CTAbd&Pelvis... LABS 2/14:  Chems- ok w/ BS=120, K=3.5, BUN/Creat=normal;  CBC- improved w/ Hg=11.9, MCV=74, Fe=30 (12%)... CT Abd&Pelvis => pending  ~  June 07, 2012:  5mo ROV & [name] is c/o weak feeling in her legs when walking, trouble getting up and down, and she uses a cane/walker to ambulate, but not exercising much; we offered more therapy at home but the patient declined...    Asthma/ ?COPD> on Advair500Bid; stable w/o resp exac despite allergy season; denies cough, sput, hemoptysis, SOB, wheezing, etc...    HBP> on Metop50Bid; BP= 112/60 today & daugh monitors her meds at home; denies CP, palpit, dizzy, SOB, edema...    HH> on Prilosec20/d, Zofran4prn; prev n/v resolved, denies choking episode/ trouble swallowing/ abd pain/ etc...    Divertics/IBS> she has known divertics & narrowing in sigmoid area; reminded to stay on Miralax/ Senakot-S/ etc to prevent recurrent diverticulitis...    Hx Breast Cancer> prev followed by DrRubin; she denies breast lesions- last Mammogram 4/10 was neg; she is not inclined to do f/u studies...    DJD, LBP> known degen lumbar spondylosis etc; part compression fx seen in lower thor vert on CXR; she denies back pain etc & has Tramadol for prn use; as noted above she is weak & debilitated, refuses to do home exercises, sits all day & can barely get up and walk w/ her cane/ walker; she declines offer for more home PT...    Osteopenia> due for f/u BMD & consideration of Bisphos therapy again; reminded to take Calcium, MVI, VitD...     Dementia> followed by DrPlovsky on Aricept10 & she has stabilized/ improved...    Anemia> etiology never fully determined but likely related to large HH; on FeSO4 daily & Hg=11.3 w/ Fe=15 (5%)- we will Rx w/ 1000mg  IV iron dextran & repeat labs in 36mo... We reviewed prob list, meds, xrays and labs> see below for updates >>  LABS 4/15:  Chems= WNL;  LFT's ok x Ab= 2.8-advised to start supplements daily(boost, ensure,ect);  Hg=11.3; Fe= low and recs for IV iron dextrose infusion.             Current Problems:   << ?ALLERGIC reaction to CIPRO in the past w/ hives, but she took cipro w/o problem 1/14 diverticulitis episode >>  ALLERGIC RHINITIS (ICD-477.9) - prev allergy eval yrs ago by DrESL & on shots for awhile. CHRONIC RHINITIS (ICD-472.0) - prev ENT eval 2005 by DrRosen for recurrent sinus infections, pressure in her head & HA's... he wanted to do Sinus CT but she declined... Rx w/ antihist/ saline/ Nasonex...  ASTHMA (ICD-493.90) - controlled on ADVAIR500 Bid; she no longer has a rescue inhaler; denies breathing difficulty. Cough, sputum, hemoptysis, etc... ~  CXR 5/07 (pre-op for breast surg) showed mild chronic changes, scarring left base, NAD.Marland Kitchen. ~  CXR 8/12 showed mod HH seen behind the heart, mild scarring vs atx left base, wedging of lower TSpine vert body... ~  12/12: went to ER w/ ?n/v/weakness; CXR- ?LLLinfiltrate vs scarring/atx; improved w/ ZPak & Amox... ~  1/14: single view CXR showed cardiomeg & vasc congestion, sm left effusion & basilar airsp dis...  HYPERTENSION (ICD-401.9) - controlled on LOPRESSOR 50mg  Bid... she was seen by [facility] in the 1990's... ~  2DEcho 4/91 showed mild MVP w/ myxomatous ant leaflet & mild MR... ~  Myocardial Perfusion Scan 5/95 was WNL showing normal contractility & uptake in all myocardial segments. ~  1/13:  BP= 142/68 & she states asymptomatic w/o CP, palpit, SOB, edema, etc... ~  7/13:  BP= 126/64 & doing well overall she says... ~  EKG 1/14  showed NSR, rate69, NSSTTWA, NAD... ~  2/14:  BP= 116/70 & she is feeling better- denies CP, palpit, ch in SOB, edema, etc... ~  4/14:  on Metop50Bid; BP= 112/60 today & daugh monitors her meds at home; denies CP, palpit, dizzy, SOB, edema.  SUPRAVENTRICULAR TACHYCARDIA >> she developed a transient SVT 9/11 during her Hosp for anemia; subseq EKG showed NSR w/ PACs & minor NSSTTWA. ~  1/14:  EKG & monitor during hosp for diverticulitis & anemia showed NSR, rate69, NSSTTWA, NAD...   VENOUS INSUFFICIENCY (ICD-459.81) - she knows to avoid sodium, elevate legs, wear support hose when able...  HIATAL HERNIA (ICD-553.3) - she uses PRILOSEC 20mg /d...  ~  Old films showed RUQ clips from prior cholecystectomy... ~  EGD 11/06 by DrJHayes showed sm HH, otherw normal, no evid of Barrett's... ~  CXR 8/12 showed ?mod sized HH behind the heart; ?source of her iron defic anemia? (only 2/18 stool cards pos for occult blood over the past yr). ~  1/14:  EGD by DrMagod (part of w/u for iron defic anemia) showed 3cmHH otherw neg...  IRRITABLE BOWEL SYNDROME (ICD-564.1) DIVERTICULOSIS OF COLON (ICD-562.10) - reminded to use Miralax/ Senakot-S as needed... ~  11/05: Hosp w/ diverticulitis & abscess> Rx'd w/ antibiotics & improved; DrStreck considered sigmoid colectomy but she improved w/ high fiber diet etc... ~  Colonoscopy 11/06 by [facility] showed divertic stricture in the rectosigmoid junction, scope passed only to the mid transverse colon...  ~  11/06: subseq BE showed innumerable divertics, some narrowing in the sigmoid region, otherw neg... ~  1/14:  Hosp for severe iron defic anemia & Colonoscopy by DrMagod could not advance past the rectosigm juction due to sharp angulation & narrowed segment beyond that;  Subsequent BE showed a segment of luminal narrowing in the distal sigmoid c/w acute diverticulitis- no leak or extravasation, no annular constricting lesion, no luminal masses or polypoid filling defects, mod  diverticulosis seen in desc & sigmoid colon;  She was treated w/ antibiotics (IV Zosyn, then PO Cipro/Flagyl) and improved; We decided to sched a CTAbd&Pelvis=> improved  ~  4/14:  Patient stated no BM problems, stools are soft, no blood  seen in stools, stools are dark but from the iron pt is currently taking.   Hx of BREAST CANCER (ICD-174.9) - off ARIMEDEX per DrRubin... she had an abn mammogram in 2007 w/ subseq right lumpectomy & sentinel node bx by DrStreck pos for invasive ductal cancer- stage I... post op XRT from DrWu, and followed by DrRubin ever since then...   DEGENERATIVE ARTHRITIS >> ~  XRay of right hip 9/07 showed marked degenerative changes w/ narrowing, sclerosis, spur formation... ~  1/14: Abd film on adm for anemia/ diverticulitis showed severe degen changes in right hip, minor changes in left hip...  LOW BACK PAIN SYNDROME (ICD-724.2) - she had lumbar surgery in 1989 by DrAplington w/ recurrent back and leg pain in 2006 & was evaluated by DrAplington & DrNudelman- he rec conservative management as the required surgery would be quite extensive... she saw DrKirshmayer in the pain management cliic as well... ~  MRI Lumbar Spine 9/07 showed degenerative lumbar spondylosis w/ assoc DDD & degen facet arthritis; multilevel spinal, lat recess, & foraminal stenoses at various levels... ~  CXR reports wedge deformity of lower thoracic vert, she denies any acute pain...  OSTEOPENIA (ICD-733.90) -  DrRubin had her on ALENDRONATE weekly + caltrate & MVI> but this was stopped ?when ?why... ~  BMD at [facility] by DrRubin 7/07 showed osteopenia w/ TScores -1.5 in Spine, & -1.3 in right hip... ~  2/13: f/u BMD==> TScores -1.2 in Spine, and -2.9 in left [facility]; she is rec to restart the FOSAMAX 70mg  Qwk... ~  4/14:  Denies pain x mild left knee pain at times. ?memory.  Has tramadol that she uses off and on when needed.  DEMENTIA >> on ARICEPT 10mg /d... ~  CT Brain 9/11 by the hospitalists showed  generalized atrophy, chronic small vessel disease, NAD (no mets), chronic sinusitis... ~  CT Brain 1/12 by DrPlovsky showed some atrophy, small vessel ischemic disease, no acute infarcts; there was also evid of extensive chronic sinusitis ~  1/13: she seems better- brighter, more spontaneous, etc... ~  CT Brain 12/13 at [facility] showed atrophy/ sm vessel dis/ NAD...  ANEMIA >> Fe-deficient anemia on FeSO4 325mg /d... ~  [facility] 9/11 w/ severe microcytic anemia ?etiology; Hg=5.3, MCV=60; Fe/Tibc weren't done; transfused ?units of ABpos blood & final Hg=11.4, MCV=71... ~  In the [facility] 9/11 her B12 level was 188 & she was given B12 shots w/o further eval; B12 level improved to 765 w/ the shots... ~  She saw DrLowne in the office 10/11 & was sched for B12 shots to start but she never returned... ~  Starting in 2/12 she was re-eval by [facility] w/ Hg=8.4, she no showed/ cancelled/ refused several attempts to sched EGD/ Colon & Hg dropped to 7.7 then 6.3; she was transfused as outpt then lost to f/u until her 8/12 OV w/ here... ~  Labs 8/12 showed Hg=10.5, MCV=73, Fe=not done> we decided on FeSO4 325mg /d w/ Vit C 500mg /d ~  Labs 12/12 showed Hg= 12.5, MCV=85, & she remains on Fe daily... ~  Presented 1/14 w/ extreme pallor, weakness, dyspnea; Hg=4.2, Fe<10, adm by [facility]=> transfused, oral Iron supplementation, & Hg improved to 9.4 at disch. ~  2/14: pt returns for post hosp visit> feeling better s/p treatment for diverticulitis & transfusion for anemia; labs today showed Hg= 11.9, MCV=74, Fe=30 ~  4/14: labs today showed Hg= 11.3 and Fe= 15.  Will set up for IV iron infusion at short stay and will recheck labs in 1 month.  VITAMIN B12 Deficiency >> asked to take B12 OTC oral supplement ~  Labs in hosp 9/11 showed Vit B12 level = 188; she was immed started on shots by the hospitalists; f/u B12 level 10/11 was 765 & DrLowne planned monthly shots but she never showed up... ~  Labs here 8/12 showed  B12 level = 242 and we decided to supplement orally daily for now... ~  Labs 1/14 in hosp showed B12 level = 628; Rec to continue oral supplement...   Past Surgical History  Procedure Laterality Date  . Appendectomy  1950;s  . Right inguinal hernia repair    . Lumbar laminectomy  1989    Dr, Simonne Come  . Laparoscopic cholecystectomy  1992    Dr. Orson Slick  . Right breast lumpectomy/sentinel node bx  06/2005    Dr. Jamey Ripa  . Colonoscopy  03/05/2012    Procedure: COLONOSCOPY;  Surgeon: Barrie Folk, MD;  Location: [facility] ENDOSCOPY;  Service: Endoscopy;  Laterality: N/A;  . Esophagogastroduodenoscopy  03/05/2012    Procedure: ESOPHAGOGASTRODUODENOSCOPY (EGD);  Surgeon: Barrie Folk, MD;  Location: [facility] ENDOSCOPY;  Service: Endoscopy;  Laterality: N/A;    Outpatient Encounter Prescriptions as of 06/07/2012  Medication Sig Dispense Refill  . Calcium Carbonate-Vitamin D (CALTRATE 600+D) 600-400 MG-UNIT per tablet Take 1 tablet by mouth 2 (two) times daily.        . cholecalciferol (VITAMIN D) 1000 UNITS tablet Take 1,000 Units by mouth daily.        Marland Kitchen donepezil (ARICEPT) 10 MG tablet Take 1 tablet (10 mg total) by mouth at bedtime.  30 tablet  11  . ferrous sulfate 325 (65 FE) MG tablet Take 325 mg by mouth daily with breakfast.      . Fluticasone-Salmeterol (ADVAIR) 500-50 MCG/DOSE AEPB Inhale 1 puff into the lungs every 12 (twelve) hours.      . metoprolol (LOPRESSOR) 50 MG tablet Take 1 tablet (50 mg total) by mouth 2 (two) times daily.  60 tablet  11  . omeprazole (PRILOSEC) 20 MG capsule TAKE ONE CAPSULE BY MOUTH EVERY DAY  30 capsule  2  . ondansetron (ZOFRAN) 4 MG tablet Take 1 tablet (4 mg total) by mouth every 8 (eight) hours as needed for nausea.  20 tablet  0  . traMADol (ULTRAM) 50 MG tablet Take 1 tablet (50 mg total) by mouth 3 (three) times daily as needed for pain.  90 tablet  5  . vitamin B-12 (CYANOCOBALAMIN) 1000 MCG tablet Take 1,000 mcg by mouth daily.       No  facility-administered encounter medications on file as of 06/07/2012.    Allergies  Allergen Reactions  . Levofloxacin     REACTION: dizziness    Current Medications, Allergies, Past Medical History, Past Surgical History, Family History, and Social History were reviewed in [employer] record.   Review of Systems       See HPI - all other systems neg except as noted...       The patient complains of some weakness, & dyspnea on exertion.  The patient denies anorexia, fever, weight loss, weight gain, vision loss, decreased hearing, hoarseness, chest pain, syncope, peripheral edema, prolonged cough, headaches, hemoptysis, abdominal pain, melena, hematochezia, severe indigestion/heartburn, hematuria, incontinence, muscle weakness, suspicious skin lesions, transient blindness, difficulty walking, depression, unusual weight change, abnormal bleeding, enlarged lymph nodes, and angioedema.   Objective:   Physical Exam    WD, thin & sl pale, 79 y/o WF in NAD.Marland KitchenMarland Kitchen  she is chronically ill appearing. GENERAL:  Alert & mostly cooperative, obvious dementia/ some confusion. HEENT:  [place]/AT, EOM-wnl, PERRLA, EACs-clear, TMs-wnl, NOSE- sl congested, THROAT-clear & wnl. NECK:  Supple w/ fairROM; no JVD; normal carotid impulses w/o bruits; no thyromegaly or nodules palpated; no lymphadenopathy. CHEST:  Clear to P & A; without wheezes or rales- few rhonchi scattered bilat... HEART:  Regular Rhythm; without murmurs/ rubs/ or gallops heard... ABDOMEN:  Soft & nontender; normal bowel sounds; no organomegaly or masses detected. EXT: without deformities, mild arthritic changes; no varicose veins/ +venous insuffic/ no edema. NEURO:  CN's intact; motor testing normal; sensory testing normal; gait normal & balance OK. DERM:  No lesions noted; no rash etc...  MMSE 10/02/10 >> Date 2012 Aug ?day; she didn't know the Pres/ VP/ Gov/ or prev administration; remembered 3/3 objects immed but 0/3 after  distraction; serial 7's= [id number]?; WORLD backwards= DLROW; proverbs were mixed- one abstracted (glass houses), others concrete (stitch, etc)...   RADIOLOGY DATA:  Reviewed in the EPIC EMR & discussed w/ the patient & daughter...  LABORATORY DATA:  Reviewed in the EPIC EMR & discussed w/ the patient & daughter...   Assessment & Plan:    Asthma> this has NOT been an issue but she has been maintained on Advair, Mucinex...  HBP> BP controlled well on the Metoprolol 50mg Bid...  HH seen on CXR & EGD 1/14> may be source of GI blood loss & her iron defic anemia; on OMEPRAZOLE 20mg /d at present...  IBS, Divertics w/ stricture in sigmoid>  See 2005 hosp for diverticulitis, then subseq work up by [facility] in 2006;  Subseq adm 1/14 for severe IDA & eval including Colonoscopy attempt & BE showed diverticulitis in sigmoid w/ narrowed segment etc;  Clinically improved w/ Cipro/Flagyl Rx;  We are planning CT Abd&Pelvis...  BREAST CANCER>  Prev followed by Celesta Aver, she has been off her Arimidex & I don't have any recent notes from him to review...  Osteopenia & ?compression fx ~L1 on CXR> back on Alendronate70/wk, (f/u BMD 2/13 w/ TScore -2.9 in left FemNeck & Alendronate restarted)... She also has severe degen arthritis in right hip & we discussed trial TRAMADOL + TYLENOL for the pain...  Hx Anemia> ?etiology (never determined w/ certainty) but most likely her mod HH seen on CXR (prev EGD/ Colon 2006 showed smHH, divertics w/ stricture, no lesions seen); Hg 12/12= 12.5, MCV=85==> continue the Fe daily...   1/14> presented for routine 5mo ROV very pale, w/ weakly heme pos stool => Hg=4.2 & sent to ER for Admit; no active bleeding seen- she was transfused, contin Fe... 2/14> office f/u w/ labs showing  Hg= 11.9, MCV=74, Fe=30 4/14> office f/u w/ labs showing  Hg= 11.3 and Fe= 15 & we gave her 1000mg  IV Iron dextran as outpt...  Vit B12 defic> it was 188 in hosp 9/11 & started on B12 shots immed by  the hospitalists; f/u level 765 shortly after disch but she never ret for continued shots; B12 level now 242 & we discussed trial of oral supplementation taking po daily...  Dementia> she is seeing DrPlovsky for Psyche> on Aricept 10mg /d...  Other medical problems as noted> aske to continue Calcium, Women's MVI, Vit D 1000u daily & B-12 1072mcg/d oral supplements...   Patient's Medications  New Prescriptions   No medications on file  Previous Medications   CALCIUM CARBONATE-VITAMIN D (CALTRATE 600+D) 600-400 MG-UNIT PER TABLET    Take 1 tablet by mouth 2 (two) times daily.  CHOLECALCIFEROL (VITAMIN D) 1000 UNITS TABLET    Take 1,000 Units by mouth daily.     DONEPEZIL (ARICEPT) 10 MG TABLET    Take 1 tablet (10 mg total) by mouth at bedtime.   FERROUS SULFATE 325 (65 FE) MG TABLET    Take 325 mg by mouth daily with breakfast.   FLUTICASONE-SALMETEROL (ADVAIR) 500-50 MCG/DOSE AEPB    Inhale 1 puff into the lungs every 12 (twelve) hours.   METOPROLOL (LOPRESSOR) 50 MG TABLET    Take 1 tablet (50 mg total) by mouth 2 (two) times daily.   OMEPRAZOLE (PRILOSEC) 20 MG CAPSULE    TAKE ONE CAPSULE BY MOUTH EVERY DAY   ONDANSETRON (ZOFRAN) 4 MG TABLET    Take 1 tablet (4 mg total) by mouth every 8 (eight) hours as needed for nausea.   TRAMADOL (ULTRAM) 50 MG TABLET    Take 1 tablet (50 mg total) by mouth 3 (three) times daily as needed for pain.   VITAMIN B-12 (CYANOCOBALAMIN) 1000 MCG TABLET    Take 1,000 mcg by mouth daily.  Modified Medications   No medications on file  Discontinued Medications   No medications on file

## 2012-06-07 NOTE — Patient Instructions (Addendum)
Today we updated your med list in our EPIC system...    Continue your current medications the same...  Today we did your follow up blood work...    We will contact you w/ the results when available...   We will arrange for a Podiatry consult for your toenail...  Call for any questions...  Marland KitchenLet's plan a follow up visit in 35mo, sooner if needed for problems.Marland KitchenMarland Kitchen

## 2012-06-08 ENCOUNTER — Other Ambulatory Visit: Payer: Self-pay | Admitting: Pulmonary Disease

## 2012-06-08 DIAGNOSIS — D508 Other iron deficiency anemias: Secondary | ICD-10-CM

## 2012-06-15 ENCOUNTER — Other Ambulatory Visit (HOSPITAL_COMMUNITY): Payer: Self-pay | Admitting: *Deleted

## 2012-06-16 ENCOUNTER — Encounter (HOSPITAL_COMMUNITY)
Admission: RE | Admit: 2012-06-16 | Discharge: 2012-06-16 | Disposition: A | Discharge status: Home / Self Care | Payer: Medicare Other | Source: Ambulatory Visit | Attending: Pulmonary Disease | Admitting: Pulmonary Disease

## 2012-06-16 DIAGNOSIS — D509 Iron deficiency anemia, unspecified: Secondary | ICD-10-CM | POA: Insufficient documentation

## 2012-06-16 MED ORDER — SODIUM CHLORIDE 0.9 % IV SOLN: INTRAVENOUS | Status: DC

## 2012-06-16 MED ORDER — SODIUM CHLORIDE 0.9 % IV SOLN
25.0000 mg | Freq: Once | INTRAVENOUS | Status: AC
  Administered 2012-06-16: 25 mg via INTRAVENOUS
  Filled 2012-06-16: qty 0.5

## 2012-06-16 MED ORDER — SODIUM CHLORIDE 0.9 % IV SOLN
1000.0000 mg | Freq: Once | INTRAVENOUS | Status: AC
  Administered 2012-06-16: 1000 mg via INTRAVENOUS
  Filled 2012-06-16 (×2): qty 20

## 2012-09-06 ENCOUNTER — Encounter: Payer: Self-pay | Admitting: Pulmonary Disease

## 2012-09-06 ENCOUNTER — Other Ambulatory Visit (INDEPENDENT_AMBULATORY_CARE_PROVIDER_SITE_OTHER): Payer: Medicare Other

## 2012-09-06 ENCOUNTER — Ambulatory Visit (INDEPENDENT_AMBULATORY_CARE_PROVIDER_SITE_OTHER): Payer: Self-pay | Admitting: Pulmonary Disease

## 2012-09-06 VITALS — BP 94/62 | HR 74 | Temp 97.5°F | Ht 66.0 in | Wt 99.2 lb

## 2012-09-06 DIAGNOSIS — D508 Other iron deficiency anemias: Secondary | ICD-10-CM

## 2012-09-06 DIAGNOSIS — C50919 Malignant neoplasm of unspecified site of unspecified female breast: Secondary | ICD-10-CM

## 2012-09-06 DIAGNOSIS — F068 Other specified mental disorders due to known physiological condition: Secondary | ICD-10-CM

## 2012-09-06 DIAGNOSIS — J45909 Unspecified asthma, uncomplicated: Secondary | ICD-10-CM

## 2012-09-06 DIAGNOSIS — K5730 Diverticulosis of large intestine without perforation or abscess without bleeding: Secondary | ICD-10-CM

## 2012-09-06 DIAGNOSIS — K449 Diaphragmatic hernia without obstruction or gangrene: Secondary | ICD-10-CM

## 2012-09-06 DIAGNOSIS — I10 Essential (primary) hypertension: Secondary | ICD-10-CM

## 2012-09-06 LAB — CBC WITH DIFFERENTIAL/PLATELET
Basophils Relative: 1.4 % (ref 0.0–3.0)
Eosinophils Relative: 2.6 % (ref 0.0–5.0)
HCT: 31.6 % — ABNORMAL LOW (ref 36.0–46.0)
Hemoglobin: 10.1 g/dL — ABNORMAL LOW (ref 12.0–15.0)
Lymphs Abs: 2.2 10*3/uL (ref 0.7–4.0)
MCV: 77.8 fl — ABNORMAL LOW (ref 78.0–100.0)
Monocytes Absolute: 1 10*3/uL (ref 0.1–1.0)
Monocytes Relative: 10.4 % (ref 3.0–12.0)
RBC: 4.07 Mil/uL (ref 3.87–5.11)
WBC: 9.8 10*3/uL (ref 4.5–10.5)

## 2012-09-06 NOTE — Progress Notes (Signed)
Subjective:    Patient ID: [name], female    DOB: [birth year], 79 y.o.   MRN: [id number]  HPI 79 y/o WF w/ multiple medical problems as noted below & she also sees DrJHayes/Magod for GI, DrRKaplan for GYN, and DrRubin for Breast Cancer...  ~  March 04, 2011:  927mo ROV & [name] visited the [facility] 12/24 w/ n/v, palpit, weakness> review of record showed rales in chest, neg abd exam, wbc=13.7, Hg=12.5, CXR w/ ?LLLpneumonia, CTBrain w/ atrophy/ sm vessel dis/ NAD;  She was treated w/ IVfluids, & given ZPak & Amox at disch, slowly improved, back to baseline...    Today she seems much improved, more jovial & spontaneous, states she is doing well, & daughter confirms, wt stable 132# w/ Ensure supplements...    Asthma/ ?COPD> on Advair500Bid; improved after episode above 7 back to baseline...    HBP> controlled on Metop50Bid; BP= 142/68 today & daugh monitors her meds at home...    HH> on Prilosec20/d; prev n/v resolved, denies choking episode/ trouble swallowing/ etc...    Divertics/IBS> she has known divertics & narrowing in sigmoid area; reminded to stay on Miralax/ Senakot-S/ etc...    Hx Breast Cancer> followed by DrRubin...    DJD, LBP> known degen lumbar spondylosis etc; part compression fx seen in lower thor vert on CXR...    Osteopenia> due for f/u BMD & consideration of Bisphos therapy again; reminded to take Calcium, MVI, VitD...    Dementia> followed by DrPlovsky on Aricept10 & she has definitely stabilized/ improved...    Anemia> etiology never fully determined but likely related to large HH; on FeSO4 daily  ~  August 28, 2011:  27mo ROV & [name] is basically stable, c/o some back discomfort today, no known trauma etc & we rec Rest, Heat, Tylenol, Advil, etc... She had BMD rechecked 2/13 w/ TScore -2.9 in left FemNeck & FOSAMAX 70mg /wk restarted... She is eating better & weight up 5# to 137# today...    We reviewed prob list, meds, xrays and labs> see below for updates >>  ~   March 02, 2012:  27mo ROV & [name] is incredibly pale and dyspneic w/ min activity; brought in by her daughter who says she didn't notice color change but mom has been c/o nausea & not eating well;  Pt denies abd pain or any pain really, she is nauseated but no vomiting; pt states that BMs are normal consistency, brown (not black) & no red blood seen;  Chart indicates 14# wt loss in 27mo to 122# today (daugh states she's not eating);  Hemodynamically stable- BP 100/60 sitting, pulse= 70 regular, RR= 20 & not labored at rest but incr dyspnea walking to bathroom...  Last labs in [facility] showed Hg=12.5 ~1 year ago...  DrJHayes, [facility] is her gastroenterologist- SEE BELOW for details: she had EGD & Colon/BE in 2006 and she refused f/u studies during a similar presentation in 2011    We reviewed prob list, meds, xrays and labs>> I discussed case w/ ED physician & we will send to [facility] ER for admission by [facility]...  ~  April 08, 2012:  8mo ROV & post hosp check> [name] was adm 1/21 - 03/07/12 by [facility] w/ severe Fe defic anemia (Hg=4.2, MCV=55, Fe<10) yet she denied abd pain & did not note any change in her stools (brown- weakly heme pos); she was transfused 3u & continued on her oral Fe (no IV Iron was used)- Hg at disch was 9.4;  Eval  in the hosp included work up by [facility] for GI> EGD showed 3cmHH, otherw normal;  Colonoscopy could not advance past the rectosigm juction due to sharp angulation & narrowed segment beyond that;  Subsequent BE showed a segment of luminal narrowing in the distal sigmoid c/w acute diverticulitis- no leak or extravasation, no annular constricting lesion, no luminal masses or polypoid filling defects, mod diverticulosis seen in desc & sigmoid colon;  She was treated w/ antibiotics (IV Zosyn, then PO Cipro/Flagyl) and improved;  She is to f/u w/ DrMagod- we discussed checking LABS today & sched a CTAbd&Pelvis... LABS 2/14:  Chems- ok w/ BS=120, K=3.5, BUN/Creat=normal;  CBC- improved w/  Hg=11.9, MCV=74, Fe=30 (12%)... CT Abd&Pelvis => pending  ~  June 07, 2012:  281mo ROV & [name] is c/o weak feeling in her legs when walking, trouble getting up and down, and she uses a cane/walker to ambulate, but not exercising much; we offered more therapy at home but the patient declined...    Asthma/ ?COPD> on Advair500Bid; stable w/o resp exac despite allergy season; denies cough, sput, hemoptysis, SOB, wheezing, etc...    HBP> on Metop50Bid; BP= 112/60 today & daugh monitors her meds at home; denies CP, palpit, dizzy, SOB, edema...    HH> on Prilosec20/d, Zofran4prn; prev n/v resolved, denies choking episode/ trouble swallowing/ abd pain/ etc...    Divertics/IBS> she has known divertics & narrowing in sigmoid area; reminded to stay on Miralax/ Senakot-S/ etc to prevent recurrent diverticulitis...    Hx Breast Cancer> prev followed by DrRubin; she denies breast lesions- last Mammogram 4/10 was neg; she is not inclined to do f/u studies...    DJD, LBP> known degen lumbar spondylosis etc; part compression fx seen in lower thor vert on CXR; she denies back pain etc & has Tramadol for prn use; as noted above she is weak & debilitated, refuses to do home exercises, sits all day & can barely get up and walk w/ her cane/ walker; she declines offer for more home PT...    Osteopenia> due for f/u BMD & consideration of Bisphos therapy again; reminded to take Calcium, MVI, VitD...    Dementia> followed by DrPlovsky on Aricept10 & she has stabilized/ improved...    Anemia> etiology never fully determined but likely related to large HH; on FeSO4 daily & Hg=11.3 w/ Fe=15 (5%)- we will Rx w/ 1000mg  IV iron dextran & repeat labs in 18mo... We reviewed prob list, meds, xrays and labs> see below for updates >>  LABS 4/15:  Chems= WNL;  LFT's ok x Ab= 2.8-advised to start supplements daily(boost, ensure,ect);  Hg=11.3; Fe= low and recs for IV iron dextrose infusion.    ~  September 06, 2012:  81mo ROV & recheck> when last  seen [name] was getting weaker, she refused more PT, labs showed Hg=11.3 but Fe=15 (5%sat) while on oral Fe for 281mo so we decided to give IV Imferon 1000mg  given 5/14 7 tol well; she did not ret to the lab for f/u blood work in June so we are checking CBC/ Fe today;  Unfortunately [name] has lost weight despite a fair appetite "I get hungry" and meals per daughter + supplements- hard to know how much she is really consuming & daughter will monitor this more closely... Weight today= 99 lbs & last weight here was 116# Feb2014; offered Megace but she declines, neither does she want nutritional counseling- advised of the very neg prognostic implications for underweight status & they understand... We did not have  time for completion of MOST Form today but we will do so on return visit...  We reviewed the following medical problems during today's office visit >>     She has persistent rhonchi in right base, left is clear & we rec adding Mucinex600Bid, Fluids, and use IS Qh at home; continue Advair500Bid...    BP is low today in light of her weight loss & Metop50Bid; BP= 94/62 & we decided to decr the Metop50- 1/2Bid for now & encourage oral intake...    Known HH on Prilosec20/d and she remains asymptomatic; ?source of slow blood loss anemia? Hx diverticulitis & narrowing in sigmoid- see below- she states stools are soft, easy to pass, denies constip or blood... We reviewed prob list, meds, xrays and labs> see below for updates >>  LABS 7/14:  CBC- Hg=10.1, MCV=78;  Fe= 17 (8%sat);  Rec- monthly IV IRON DEXTRAN 1000mg  Qmo x 3 months w/ rov & labs after that...          Current Problems:   << ?ALLERGIC reaction to CIPRO in the past w/ hives, but she took cipro w/o problem 1/14 diverticulitis episode >>  ALLERGIC RHINITIS (ICD-477.9) - prev allergy eval yrs ago by DrESL & on shots for awhile. CHRONIC RHINITIS (ICD-472.0) - prev ENT eval 2005 by DrRosen for recurrent sinus infections, pressure in her head & HA's...  he wanted to do Sinus CT but she declined... Rx w/ antihist/ saline/ Nasonex...  ASTHMA (ICD-493.90) - controlled on ADVAIR500 Bid; she no longer has a rescue inhaler; denies breathing difficulty. Cough, sputum, hemoptysis, etc... ~  CXR 5/07 (pre-op for breast surg) showed mild chronic changes, scarring left base, NAD.Marland Kitchen. ~  CXR 8/12 showed mod HH seen behind the heart, mild scarring vs atx left base, wedging of lower TSpine vert body... ~  12/12: went to ER w/ ?n/v/weakness; CXR- ?LLLinfiltrate vs scarring/atx; improved w/ ZPak & Amox... ~  1/14: single view CXR showed cardiomeg & vasc congestion, sm left effusion & basilar airsp dis... ~  7/14: we added Mucinex600Bid, fluids, IS Q1h while awake, and continue the Advair500Bid...  HYPERTENSION (ICD-401.9) - controlled on LOPRESSOR 50mg  Bid... she was seen by [facility] in the 1990's... ~  2DEcho 4/91 showed mild MVP w/ myxomatous ant leaflet & mild MR... ~  Myocardial Perfusion Scan 5/95 was WNL showing normal contractility & uptake in all myocardial segments. ~  1/13:  BP= 142/68 & she states asymptomatic w/o CP, palpit, SOB, edema, etc... ~  7/13:  BP= 126/64 & doing well overall she says... ~  EKG 1/14 showed NSR, rate69, NSSTTWA, NAD... ~  2/14:  BP= 116/70 & she is feeling better- denies CP, palpit, ch in SOB, edema, etc... ~  4/14:  on Metop50Bid; BP= 112/60 today & daugh monitors her meds at home; denies CP, palpit, dizzy, SOB, edema. ~  7/14: on Metop50Bid but she has lost weight down to 99# & BP= 94/62; we decided to decr Metop50 to 1/2Bid...  SUPRAVENTRICULAR TACHYCARDIA >> she developed a transient SVT 9/11 during her Hosp for anemia; subseq EKG showed NSR w/ PACs & minor NSSTTWA. ~  1/14:  EKG & monitor during hosp for diverticulitis & anemia showed NSR, rate69, NSSTTWA, NAD...   VENOUS INSUFFICIENCY (ICD-459.81) - she knows to avoid sodium, elevate legs, wear support hose when able...  HIATAL HERNIA (ICD-553.3) - she uses PRILOSEC  20mg /d...  ~  Old films showed RUQ clips from prior cholecystectomy... ~  EGD 11/06 by DrJHayes showed sm HH, otherw  normal, no evid of Barrett's... ~  CXR 8/12 showed ?mod sized HH behind the heart; ?source of her iron defic anemia? (only 2/18 stool cards pos for occult blood over the past yr). ~  1/14:  EGD by DrMagod (part of w/u for iron defic anemia) showed 3cmHH otherw neg...  IRRITABLE BOWEL SYNDROME (ICD-564.1) DIVERTICULOSIS OF COLON (ICD-562.10) - reminded to use Miralax/ Senakot-S as needed... ~  11/05: Hosp w/ diverticulitis & abscess> Rx'd w/ antibiotics & improved; DrStreck considered sigmoid colectomy but she improved w/ high fiber diet etc... ~  Colonoscopy 11/06 by [facility] showed divertic stricture in the rectosigmoid junction, scope passed only to the mid transverse colon...  ~  11/06: subseq BE showed innumerable divertics, some narrowing in the sigmoid region, otherw neg... ~  1/14:  Hosp for severe iron defic anemia & Colonoscopy by DrMagod could not advance past the rectosigm juction due to sharp angulation & narrowed segment beyond that;  Subsequent BE showed a segment of luminal narrowing in the distal sigmoid c/w acute diverticulitis- no leak or extravasation, no annular constricting lesion, no luminal masses or polypoid filling defects, mod diverticulosis seen in desc & sigmoid colon;  She was treated w/ antibiotics (IV Zosyn, then PO Cipro/Flagyl) and improved; We decided to sched a CTAbd&Pelvis=> improved  ~  4/14:  Patient stated no BM problems, stools are soft, no blood seen in stools, stools are dark but from the iron pt is currently taking.   Hx of BREAST CANCER (ICD-174.9) - off ARIMEDEX per DrRubin... she had an abn mammogram in 2007 w/ subseq right lumpectomy & sentinel node bx by DrStreck pos for invasive ductal cancer- stage I... post op XRT from DrWu, and followed by DrRubin ever since then...   DEGENERATIVE ARTHRITIS >> ~  XRay of right hip 9/07 showed marked  degenerative changes w/ narrowing, sclerosis, spur formation... ~  1/14: Abd film on adm for anemia/ diverticulitis showed severe degen changes in right hip, minor changes in left hip...  LOW BACK PAIN SYNDROME (ICD-724.2) - she had lumbar surgery in 1989 by DrAplington w/ recurrent back and leg pain in 2006 & was evaluated by DrAplington & DrNudelman- he rec conservative management as the required surgery would be quite extensive... she saw DrKirshmayer in the pain management cliic as well... ~  MRI Lumbar Spine 9/07 showed degenerative lumbar spondylosis w/ assoc DDD & degen facet arthritis; multilevel spinal, lat recess, & foraminal stenoses at various levels... ~  CXR reports wedge deformity of lower thoracic vert, she denies any acute pain...  OSTEOPENIA (ICD-733.90) -  DrRubin had her on ALENDRONATE weekly + caltrate & MVI> but this was stopped ?when ?why... ~  BMD at [facility] by DrRubin 7/07 showed osteopenia w/ TScores -1.5 in Spine, & -1.3 in right hip... ~  2/13: f/u BMD==> TScores -1.2 in Spine, and -2.9 in left [facility]; she is rec to restart the FOSAMAX 70mg  Qwk=> she stopped again... ~  4/14:  Denies pain x mild left knee pain at times. ?memory. Has Tramadol that she uses off and on when needed.  DEMENTIA >> on ARICEPT 10mg /d... ~  CT Brain 9/11 by the hospitalists showed generalized atrophy, chronic small vessel disease, NAD (no mets), chronic sinusitis... ~  CT Brain 1/12 by DrPlovsky showed some atrophy, small vessel ischemic disease, no acute infarcts; there was also evid of extensive chronic sinusitis ~  1/13: she seems better- brighter, more spontaneous, etc... ~  CT Brain 12/13 at [facility] showed atrophy/ sm vessel dis/  NAD.Marland KitchenMarland Kitchen  ANEMIA >> Fe-deficient anemia on FeSO4 325mg /d... ~  [facility] 9/11 w/ severe microcytic anemia ?etiology; Hg=5.3, MCV=60; Fe/Tibc weren't done; transfused ?units of ABpos blood & final Hg=11.4, MCV=71... ~  In the [facility] 9/11 her B12 level was 188 & she was  given B12 shots w/o further eval; B12 level improved to 765 w/ the shots... ~  She saw DrLowne in the office 10/11 & was sched for B12 shots to start but she never returned... ~  Starting in 2/12 she was re-eval by [facility] w/ Hg=8.4, she no showed/ cancelled/ refused several attempts to sched EGD/ Colon & Hg dropped to 7.7 then 6.3; she was transfused as outpt then lost to f/u until her 8/12 OV w/ here... ~  Labs 8/12 showed Hg=10.5, MCV=73, Fe=not done> we decided on FeSO4 325mg /d w/ Vit C 500mg /d ~  Labs 12/12 showed Hg= 12.5, MCV=85, & she remains on Fe daily... ~  Presented 1/14 w/ extreme pallor, weakness, dyspnea; Hg=4.2, Fe<10, adm by [facility]=> transfused, oral Iron supplementation, & Hg improved to 9.4 at disch. ~  2/14: pt returns for post hosp visit> feeling better s/p treatment for diverticulitis & transfusion for anemia; labs today showed Hg= 11.9, MCV=74, Fe=30 ~  4/14: labs today showed Hg= 11.3 and Fe= 15.  Will set up for IV iron infusion at short stay => given 1000mg  IV Iron Dextran 5/14... ~  Labs 7/14 showed CBC- Hg=10.1, MCV=78;  Fe= 17 (8%sat);  Rec- monthly IV IRON DEXTRAN 1000mg  Qmo x 3 months w/ rov & labs after that...  VITAMIN B12 Deficiency >> asked to take B12 OTC oral supplement ~  Labs in hosp 9/11 showed Vit B12 level = 188; she was immed started on shots by the hospitalists; f/u B12 level 10/11 was 765 & DrLowne planned monthly shots but she never showed up... ~  Labs here 8/12 showed B12 level = 242 and we decided to supplement orally daily for now... ~  Labs 1/14 in hosp showed B12 level = 628; Rec to continue oral supplement...   Past Surgical History  Procedure Laterality Date  . Appendectomy  1950;s  . Right inguinal hernia repair    . Lumbar laminectomy  1989    Dr, Simonne Come  . Laparoscopic cholecystectomy  1992    Dr. Orson Slick  . Right breast lumpectomy/sentinel node bx  06/2005    Dr. Jamey Ripa  . Colonoscopy  03/05/2012    Procedure:  COLONOSCOPY;  Surgeon: Barrie Folk, MD;  Location: [facility] ENDOSCOPY;  Service: Endoscopy;  Laterality: N/A;  . Esophagogastroduodenoscopy  03/05/2012    Procedure: ESOPHAGOGASTRODUODENOSCOPY (EGD);  Surgeon: Barrie Folk, MD;  Location: [facility] ENDOSCOPY;  Service: Endoscopy;  Laterality: N/A;    Outpatient Encounter Prescriptions as of 09/06/2012  Medication Sig Dispense Refill  . Calcium Carbonate-Vitamin D (CALTRATE 600+D) 600-400 MG-UNIT per tablet Take 1 tablet by mouth 2 (two) times daily.        . cholecalciferol (VITAMIN D) 1000 UNITS tablet Take 1,000 Units by mouth daily.        Marland Kitchen donepezil (ARICEPT) 10 MG tablet Take 1 tablet (10 mg total) by mouth at bedtime.  30 tablet  11  . ferrous sulfate 325 (65 FE) MG tablet Take 325 mg by mouth daily with breakfast.      . Fluticasone-Salmeterol (ADVAIR) 500-50 MCG/DOSE AEPB Inhale 1 puff into the lungs every 12 (twelve) hours.      Marland Kitchen guaiFENesin (MUCINEX) 600 MG 12 hr tablet Take  600 mg by mouth 2 (two) times daily.      . metoprolol (LOPRESSOR) 50 MG tablet Take 1/2 tablet by mouth two times daily      . omeprazole (PRILOSEC) 20 MG capsule TAKE ONE CAPSULE BY MOUTH EVERY DAY  30 capsule  2  . ondansetron (ZOFRAN) 4 MG tablet Take 1 tablet (4 mg total) by mouth every 8 (eight) hours as needed for nausea.  20 tablet  0  . traMADol (ULTRAM) 50 MG tablet Take 1 tablet (50 mg total) by mouth 3 (three) times daily as needed for pain.  90 tablet  5  . vitamin B-12 (CYANOCOBALAMIN) 1000 MCG tablet Take 1,000 mcg by mouth daily.      . [DISCONTINUED] metoprolol (LOPRESSOR) 50 MG tablet Take 1 tablet (50 mg total) by mouth 2 (two) times daily.  60 tablet  11   No facility-administered encounter medications on file as of 09/06/2012.    Allergies  Allergen Reactions  . Levofloxacin     REACTION: dizziness    Current Medications, Allergies, Past Medical History, Past Surgical History, Family History, and Social History were reviewed in [employer] record.   Review of Systems       See HPI - all other systems neg except as noted...       The patient complains of some weakness, & dyspnea on exertion.  The patient denies anorexia, fever, weight loss, weight gain, vision loss, decreased hearing, hoarseness, chest pain, syncope, peripheral edema, prolonged cough, headaches, hemoptysis, abdominal pain, melena, hematochezia, severe indigestion/heartburn, hematuria, incontinence, muscle weakness, suspicious skin lesions, transient blindness, difficulty walking, depression, unusual weight change, abnormal bleeding, enlarged lymph nodes, and angioedema.   Objective:   Physical Exam    WD, thin & sl pale, 79 y/o WF in NAD... she is chronically ill appearing. GENERAL:  Alert & mostly cooperative, obvious dementia/ some confusion. HEENT:  [place]/AT, EOM-wnl, PERRLA, EACs-clear, TMs-wnl, NOSE- sl congested, THROAT-clear & wnl. NECK:  Supple w/ fairROM; no JVD; normal carotid impulses w/o bruits; no thyromegaly or nodules palpated; no lymphadenopathy. CHEST:  Clear to P & A; without wheezes or rales- few rhonchi scattered bilat... HEART:  Regular Rhythm; without murmurs/ rubs/ or gallops heard... ABDOMEN:  Soft & nontender; normal bowel sounds; no organomegaly or masses detected. EXT: without deformities, mild arthritic changes; no varicose veins/ +venous insuffic/ no edema. NEURO:  CN's intact; motor testing normal; sensory testing normal; gait normal & balance OK. DERM:  No lesions noted; no rash etc...  MMSE 10/02/10 >> Date 2012 Aug ?day; she didn't know the Pres/ VP/ Gov/ or prev administration; remembered 3/3 objects immed but 0/3 after distraction; serial 7's= 100-93-86-7?; WORLD backwards= DLROW; proverbs were mixed- one abstracted (glass houses), others concrete (stitch, etc)...   RADIOLOGY DATA:  Reviewed in the EPIC EMR & discussed w/ the patient & daughter...  LABORATORY DATA:  Reviewed in the EPIC EMR & discussed w/ the  patient & daughter...   Assessment & Plan:    Asthma> this has not been an issue but had RLL atx 7 effusion in past; she has been maintained on Advair, Mucinex, & we will add IS etc...  HBP> BP too low now on Metoprolol 50mg Bid due to recent weight loss; rec to decr Metop50 to 1/2 Bid...  HH seen on CXR & EGD 1/14> may be source of GI blood loss & her iron defic anemia; on OMEPRAZOLE 20mg /d at present...  IBS, Divertics w/ stricture in sigmoid>  See  2005 hosp for diverticulitis, then subseq work up by [facility] in 2006;  Subseq adm 1/14 for severe IDA & eval including Colonoscopy attempt & BE showed diverticulitis in sigmoid w/ narrowed segment etc;  Clinically improved w/ Cipro/Flagyl Rx;  We are planning CT Abd&Pelvis...  BREAST CANCER>  Prev followed by Celesta Aver, she has been off her Arimidex & I don't have any recent notes from him to review...  Osteopenia & ?compression fx ~L1 on CXR> off Alendronate again, (f/u BMD 2/13 w/ TScore -2.9 in left FemNeck & Alendronate restarted)... She also has severe degen arthritis in right hip & we discussed trial TRAMADOL + TYLENOL for the pain...  Hx Anemia> ?etiology (never determined w/ certainty) but most likely her mod HH seen on CXR (prev EGD/ Colon 2006 showed smHH, divertics w/ stricture, no lesions seen); Hg 12/12= 12.5, MCV=85==> continue the Fe daily...   1/14> presented for routine 18mo ROV very pale, w/ weakly heme pos stool => Hg=4.2 & sent to ER for Admit; no active bleeding seen- she was transfused, contin Fe... 2/14> office f/u w/ labs showing Hg= 11.9, MCV=74, Fe=30 4/14> office f/u w/ labs showing Hg= 11.3 and Fe= 15 & we gave her 1000mg  IV Iron dextran as outpt=> done 5/14. 7/14> office f/u w/ labs showing Hg= 10.1, MCV=78;  Fe= 17 (8%sat);  Rec- monthly IV IRON DEXTRAN 1000mg  Qmo x 3 months w/ rov & labs after that...  Vit B12 defic> it was 188 in hosp 9/11 & started on B12 shots immed by the hospitalists; f/u level 765 shortly  after disch but she never ret for continued shots; B12 level now 242 & we discussed trial of oral supplementation taking po daily...  Dementia> she is seeing DrPlovsky for Psyche> on Aricept 10mg /d...  Other medical problems as noted> aske to continue Calcium, Women's MVI, Vit D 1000u daily & B-12 1061mcg/d oral supplements...   Patient's Medications  New Prescriptions   No medications on file  Previous Medications   CALCIUM CARBONATE-VITAMIN D (CALTRATE 600+D) 600-400 MG-UNIT PER TABLET    Take 1 tablet by mouth 2 (two) times daily.     CHOLECALCIFEROL (VITAMIN D) 1000 UNITS TABLET    Take 1,000 Units by mouth daily.     DONEPEZIL (ARICEPT) 10 MG TABLET    Take 1 tablet (10 mg total) by mouth at bedtime.   FERROUS SULFATE 325 (65 FE) MG TABLET    Take 325 mg by mouth daily with breakfast.   FLUTICASONE-SALMETEROL (ADVAIR) 500-50 MCG/DOSE AEPB    Inhale 1 puff into the lungs every 12 (twelve) hours.   GUAIFENESIN (MUCINEX) 600 MG 12 HR TABLET    Take 600 mg by mouth 2 (two) times daily.   OMEPRAZOLE (PRILOSEC) 20 MG CAPSULE    TAKE ONE CAPSULE BY MOUTH EVERY DAY   ONDANSETRON (ZOFRAN) 4 MG TABLET    Take 1 tablet (4 mg total) by mouth every 8 (eight) hours as needed for nausea.   TRAMADOL (ULTRAM) 50 MG TABLET    Take 1 tablet (50 mg total) by mouth 3 (three) times daily as needed for pain.   VITAMIN B-12 (CYANOCOBALAMIN) 1000 MCG TABLET    Take 1,000 mcg by mouth daily.  Modified Medications   Modified Medication Previous Medication   METOPROLOL (LOPRESSOR) 50 MG TABLET metoprolol (LOPRESSOR) 50 MG tablet      Take 1/2 tablet by mouth two times daily    Take 1 tablet (50 mg total) by mouth 2 (two)  times daily.  Discontinued Medications   No medications on file

## 2012-09-06 NOTE — Patient Instructions (Addendum)
Today we updated your med list in our EPIC system...    We decided to decrease the Metoprolol 50mg  to 1/2 tab twice daily...  We added MUCINEX OTC- 600mg  one tab twice daily w/ fluids for the thick phlegm...  Use the incentive spirometer device to expand your lungs well & open-up the left base...  Today we checked your follow up blood count & iron level to see if you need another iron infusion at this time...    We will contact you w/ the results when available...   Call for any questions...  Let's plan a follow up visit in 6mo, sooner if needed for problems.Marland KitchenMarland Kitchen

## 2012-09-10 ENCOUNTER — Other Ambulatory Visit: Payer: Self-pay | Admitting: Pulmonary Disease

## 2012-09-10 DIAGNOSIS — D508 Other iron deficiency anemias: Secondary | ICD-10-CM

## 2012-09-14 ENCOUNTER — Telehealth: Payer: Self-pay | Admitting: Pulmonary Disease

## 2012-09-14 NOTE — Telephone Encounter (Signed)
1) Thurs 09/23/12 at 8:00 am at [facility] Short Stay. Pt will need to arrive at 8:45 and check in at admissions. No prep, may eat breakfast. Infusion will take 6-7 hours. 2) 11/02/12 at 8:00 at [facility] Short Stay and check in at admissions 3) 12/02/12 at 8:00 at [facility] Short Stay and check in at admissions. Daughter was driving and was advised that I spoke with patient this morning and that her mother took down my phone number and if she needed to call me back to get the additional dates and times that would be fine. My phone number is [phone]. Rhonda J Cobb

## 2012-09-14 NOTE — Telephone Encounter (Signed)
Called and lmoam for pt's daughter today on her work #. However, patient is not in her office today. Gave daughter the first of three appointments due to her driving. First of three appointments, was scheduled for Thurs. 09/23/12

## 2012-09-23 ENCOUNTER — Encounter (HOSPITAL_COMMUNITY)
Admission: RE | Admit: 2012-09-23 | Discharge: 2012-09-23 | Disposition: A | Discharge status: Home / Self Care | Payer: Medicare Other | Source: Ambulatory Visit | Attending: Pulmonary Disease | Admitting: Pulmonary Disease

## 2012-09-23 ENCOUNTER — Other Ambulatory Visit (HOSPITAL_COMMUNITY): Payer: Self-pay | Admitting: Pulmonary Disease

## 2012-09-23 ENCOUNTER — Encounter (HOSPITAL_COMMUNITY): Payer: Self-pay

## 2012-09-23 VITALS — BP 125/73 | HR 84 | Temp 97.0°F | Resp 18

## 2012-09-23 DIAGNOSIS — D508 Other iron deficiency anemias: Secondary | ICD-10-CM

## 2012-09-23 MED ORDER — SODIUM CHLORIDE 0.9 % IV SOLN
Freq: Once | INTRAVENOUS | Status: AC
  Administered 2012-09-23: 10:00:00 via INTRAVENOUS

## 2012-09-23 MED ORDER — SODIUM CHLORIDE 0.9 % IV SOLN: 1000.0000 mg | Freq: Once | INTRAVENOUS | Status: DC

## 2012-09-23 MED ORDER — IRON DEXTRAN 50 MG/ML IJ SOLN
1000.0000 mg | Freq: Once | INTRAMUSCULAR | Status: AC
  Administered 2012-09-23: 1000 mg via INTRAVENOUS
  Filled 2012-09-23: qty 20

## 2012-09-23 MED ORDER — SODIUM CHLORIDE 0.9 % IV SOLN
25.0000 mg | Freq: Once | INTRAVENOUS | Status: AC
  Administered 2012-09-23: 25 mg via INTRAVENOUS
  Filled 2012-09-23: qty 0.5

## 2012-09-23 MED ORDER — SODIUM CHLORIDE 0.9 % IV SOLN: Freq: Once | INTRAVENOUS | Status: DC

## 2012-09-24 ENCOUNTER — Encounter (HOSPITAL_COMMUNITY): Payer: Medicare Other

## 2012-10-29 ENCOUNTER — Encounter (HOSPITAL_COMMUNITY): Payer: Medicare Other

## 2012-11-02 ENCOUNTER — Encounter (HOSPITAL_COMMUNITY)
Admission: RE | Admit: 2012-11-02 | Discharge: 2012-11-02 | Disposition: A | Discharge status: Home / Self Care | Payer: Medicare Other | Source: Ambulatory Visit | Attending: Pulmonary Disease | Admitting: Pulmonary Disease

## 2012-11-02 ENCOUNTER — Encounter (HOSPITAL_COMMUNITY): Payer: Self-pay

## 2012-11-02 VITALS — BP 124/63 | HR 70 | Temp 97.7°F | Resp 18

## 2012-11-02 DIAGNOSIS — D508 Other iron deficiency anemias: Secondary | ICD-10-CM | POA: Insufficient documentation

## 2012-11-02 MED ORDER — SODIUM CHLORIDE 0.9 % IV SOLN
INTRAVENOUS | Status: DC
  Administered 2012-11-02: 08:00:00 via INTRAVENOUS

## 2012-11-02 MED ORDER — SODIUM CHLORIDE 0.9 % IV SOLN
1000.0000 mg | Freq: Once | INTRAVENOUS | Status: AC
  Administered 2012-11-02: 1000 mg via INTRAVENOUS
  Filled 2012-11-02: qty 20

## 2012-11-02 NOTE — Progress Notes (Signed)
Iron Infusion complete at 1420. No signs of adverse reaction noted. Instructed patient and her grandson to call Dr. Kriste Basque for problems and concerns once discharged. Both verbalize understanding.

## 2012-11-04 ENCOUNTER — Other Ambulatory Visit: Payer: Self-pay | Admitting: Pulmonary Disease

## 2012-11-26 ENCOUNTER — Encounter (HOSPITAL_COMMUNITY): Payer: Medicare Other

## 2012-11-30 ENCOUNTER — Ambulatory Visit (INDEPENDENT_AMBULATORY_CARE_PROVIDER_SITE_OTHER): Payer: Medicare Other | Admitting: Pulmonary Disease

## 2012-11-30 ENCOUNTER — Other Ambulatory Visit (INDEPENDENT_AMBULATORY_CARE_PROVIDER_SITE_OTHER): Payer: Medicare Other

## 2012-11-30 ENCOUNTER — Encounter: Payer: Self-pay | Admitting: Pulmonary Disease

## 2012-11-30 VITALS — BP 110/70 | HR 98 | Temp 97.4°F | Ht 66.0 in

## 2012-11-30 DIAGNOSIS — C50919 Malignant neoplasm of unspecified site of unspecified female breast: Secondary | ICD-10-CM

## 2012-11-30 DIAGNOSIS — F419 Anxiety disorder, unspecified: Secondary | ICD-10-CM

## 2012-11-30 DIAGNOSIS — D649 Anemia, unspecified: Secondary | ICD-10-CM

## 2012-11-30 DIAGNOSIS — M79609 Pain in unspecified limb: Secondary | ICD-10-CM

## 2012-11-30 DIAGNOSIS — J45909 Unspecified asthma, uncomplicated: Secondary | ICD-10-CM

## 2012-11-30 DIAGNOSIS — D518 Other vitamin B12 deficiency anemias: Secondary | ICD-10-CM

## 2012-11-30 DIAGNOSIS — K449 Diaphragmatic hernia without obstruction or gangrene: Secondary | ICD-10-CM

## 2012-11-30 DIAGNOSIS — M79604 Pain in right leg: Secondary | ICD-10-CM

## 2012-11-30 DIAGNOSIS — F411 Generalized anxiety disorder: Secondary | ICD-10-CM

## 2012-11-30 DIAGNOSIS — M545 Low back pain: Secondary | ICD-10-CM

## 2012-11-30 DIAGNOSIS — M1990 Unspecified osteoarthritis, unspecified site: Secondary | ICD-10-CM

## 2012-11-30 DIAGNOSIS — F068 Other specified mental disorders due to known physiological condition: Secondary | ICD-10-CM

## 2012-11-30 DIAGNOSIS — D508 Other iron deficiency anemias: Secondary | ICD-10-CM

## 2012-11-30 DIAGNOSIS — I10 Essential (primary) hypertension: Secondary | ICD-10-CM

## 2012-11-30 DIAGNOSIS — K5730 Diverticulosis of large intestine without perforation or abscess without bleeding: Secondary | ICD-10-CM

## 2012-11-30 DIAGNOSIS — M810 Age-related osteoporosis without current pathological fracture: Secondary | ICD-10-CM | POA: Insufficient documentation

## 2012-11-30 DIAGNOSIS — K589 Irritable bowel syndrome without diarrhea: Secondary | ICD-10-CM

## 2012-11-30 LAB — HEPATIC FUNCTION PANEL
ALT: 9 U/L (ref 0–35)
AST: 14 U/L (ref 0–37)
Total Bilirubin: 0.5 mg/dL (ref 0.3–1.2)
Total Protein: 7.5 g/dL (ref 6.0–8.3)

## 2012-11-30 LAB — CBC WITH DIFFERENTIAL/PLATELET
Eosinophils Absolute: 0.2 10*3/uL (ref 0.0–0.7)
Eosinophils Relative: 1.6 % (ref 0.0–5.0)
Lymphocytes Relative: 19.3 % (ref 12.0–46.0)
MCV: 79.8 fl (ref 78.0–100.0)
Monocytes Absolute: 0.8 10*3/uL (ref 0.1–1.0)
Neutrophils Relative %: 70.3 % (ref 43.0–77.0)
Platelets: 857 10*3/uL — ABNORMAL HIGH (ref 150.0–400.0)
WBC: 12.5 10*3/uL — ABNORMAL HIGH (ref 4.5–10.5)

## 2012-11-30 LAB — BASIC METABOLIC PANEL
BUN: 8 mg/dL (ref 6–23)
CO2: 23 mEq/L (ref 19–32)
Chloride: 98 mEq/L (ref 96–112)
Glucose, Bld: 97 mg/dL (ref 70–99)
Potassium: 3.7 mEq/L (ref 3.5–5.1)

## 2012-11-30 LAB — SEDIMENTATION RATE: Sed Rate: 96 mm/hr — ABNORMAL HIGH (ref 0–22)

## 2012-11-30 LAB — TSH: TSH: 3.07 u[IU]/mL (ref 0.35–5.50)

## 2012-11-30 LAB — IBC PANEL: Iron: 14 ug/dL — ABNORMAL LOW (ref 42–145)

## 2012-11-30 MED ORDER — PREDNISONE 20 MG PO TABS: 20.0000 mg | ORAL_TABLET | Freq: Every day | ORAL | Status: AC

## 2012-11-30 MED ORDER — MEGESTROL ACETATE 40 MG/ML PO SUSP: 200.0000 mg | Freq: Four times a day (QID) | ORAL | Status: AC

## 2012-11-30 NOTE — Patient Instructions (Signed)
Today we updated your med list in our EPIC system...    Continue your current medications the same...  We decided to add Prednisone 20mg  /d for the next month to see if you have a beneficial response w/ less pain, improved mobility, etc...  We added MEGACE to help w/ appetite - take 1 tsp 4 times daily...  Today we did some follow up blood work>     We will contact you w/ the results when available...   Please go ahead w/ the 3rd iron infusion as planned this week...    We will determine the need for any additional Iron Rx after we review her labs...  Let's plan a brief ROV in 1 month to document the response to the Prednisone etc..[name]

## 2012-11-30 NOTE — Progress Notes (Signed)
Subjective:    Patient ID: [name], female    DOB: 12-Oct-1933, 79 y.o.   MRN: [id number]  HPI 79 y/o WF w/ multiple medical problems as noted below & she also sees DrJHayes/Magod for GI, DrRKaplan for GYN, and DrRubin for Breast Cancer... SEE PREV EPIC NOTES FOR THE OLDER VISITS >>   ~  August 28, 2011:  20mo ROV & [name] is basically stable, c/o some back discomfort today, no known trauma etc & we rec Rest, Heat, Tylenol, Advil, etc... She had BMD rechecked 2/13 w/ TScore -2.9 in left FemNeck & FOSAMAX 70mg /wk restarted... She is eating better & weight up 5# to 137# today...  We reviewed prob list, meds, xrays and labs> see below for updates >>  ~  March 02, 2012:  20mo ROV & [name] is incredibly pale and dyspneic w/ min activity; brought in by her daughter who says she didn't notice color change but mom has been c/o nausea & not eating well;  Pt denies abd pain or any pain really, she is nauseated but no vomiting; pt states that BMs are normal consistency, brown (not black) & no red blood seen;  Chart indicates 14# wt loss in 20mo to 122# today (daugh states she's not eating);  Hemodynamically stable- BP 100/60 sitting, pulse= 70 regular, RR= 20 & not labored at rest but incr dyspnea walking to bathroom...  Last labs in EPIC showed Hg=12.5 ~1 year ago...  DrJHayes, [facility] is her gastroenterologist- SEE BELOW for details: she had EGD & Colon/BE in 2006 and she refused f/u studies during a similar presentation in 2011...    We reviewed prob list, meds, xrays and labs>> I discussed case w/ ED physician & we will send to [facility] for admission by [facility]...  ~  April 08, 2012:  734mo ROV & post hosp check> [name] was adm 1/21 - 03/07/12 by [facility] w/ severe Fe defic anemia (Hg=4.2, MCV=55, Fe<10) yet she denied abd pain & did not note any change in her stools (brown- weakly heme pos); she was transfused 3u & continued on her oral Fe (no IV Iron was used)- Hg at disch was 9.4;  Eval in the hosp included  work up by [employer] for GI> EGD showed 3cmHH, otherw normal;  Colonoscopy could not advance past the rectosigm juction due to sharp angulation & narrowed segment beyond that;  Subsequent BE showed a segment of luminal narrowing in the distal sigmoid c/w acute diverticulitis- no leak or extravasation, no annular constricting lesion, no luminal masses or polypoid filling defects, mod diverticulosis seen in desc & sigmoid colon;  She was treated w/ antibiotics (IV Zosyn, then PO Cipro/Flagyl) and improved;  She is to f/u w/ DrMagod- we discussed checking LABS today & sched a CTAbd&Pelvis... LABS 2/14:  Chems- ok w/ BS=120, K=3.5, BUN/Creat=normal;  CBC- improved w/ Hg=11.9, MCV=74, Fe=30 (12%)... CT Abd&Pelvis => done 3/14 & showed modHH, GB clips, mod atheromatous calcif in Ao, divertics, short segm rectosigmoid wall thickening, hip degen changes & lumbar sp degen... (placed on Fe, VitC, Miralax, Senakot-S)  ~  June 07, 2012:  34mo ROV & [name] is c/o weak feeling in her legs when walking, trouble getting up and down, and she uses a cane/walker to ambulate, but not exercising much; we offered more therapy at home but the patient declined...    Asthma/ ?COPD> on Advair500Bid; stable w/o resp exac despite allergy season; denies cough, sput, hemoptysis, SOB, wheezing, etc...    HBP> on Metop50Bid;  BP= 112/60 today & daugh monitors her meds at home; denies CP, palpit, dizzy, SOB, edema...    HH> on Prilosec20/d, Zofran4prn; prev n/v resolved, denies choking episode/ trouble swallowing/ abd pain/ etc...    Divertics/IBS> she has known divertics & narrowing in sigmoid area; reminded to stay on Miralax/ Senakot-S/ etc to prevent recurrent diverticulitis...    Hx Breast Cancer> prev followed by DrRubin; she denies breast lesions- last Mammogram 4/10 was neg; she is not inclined to do f/u studies...    DJD, LBP> known degen lumbar spondylosis etc; part compression fx seen in lower thor vert on CXR; she denies back pain  etc & has Tramadol for prn use; as noted above she is weak & debilitated, refuses to do home exercises, sits all day & can barely get up and walk w/ her cane/ walker; she declines offer for more home PT...    Osteopenia> due for f/u BMD & consideration of Bisphos therapy again; reminded to take Calcium, MVI, VitD...    Dementia> followed by DrPlovsky on Aricept10 & she has stabilized/ improved...    Anemia> etiology never fully determined but likely related to GI pathology- large HH & rectosig changes; on FeSO4 daily & Hg=11.3 w/ Fe=15 (5%)- we will Rx w/ 1000mg  IV iron dextran & repeat labs in 27mo... We reviewed prob list, meds, xrays and labs> see below for updates >>  LABS 4/15:  Chems= WNL;  LFT's ok x Ab= 2.8-advised to start supplements daily(boost, ensure,ect);  Hg=11.3; Fe= low and recs for IV iron dextrose infusion.    ~  September 06, 2012:  25mo ROV & recheck> when last seen [name] was getting weaker, she refused more PT, labs showed Hg=11.3 but Fe=15 (5%sat) while on oral Fe for 48mo so we decided to give IV Imferon 1000mg  given 5/14 & tol well; she did not ret to the lab for f/u blood work in June so we are checking CBC/ Fe today;  Unfortunately [name] has lost weight despite a fair appetite "I get hungry" and meals per daughter + supplements- hard to know how much she is really consuming & daughter will monitor this more closely... Weight today= 99 lbs & last weight here was 116# Feb2014; offered Megace but she declines, neither does she want nutritional counseling- advised of the very neg prognostic implications for underweight status & they understand... We did not have time for completion of MOST Form today but we will do so on return visit...  We reviewed the following medical problems during today's office visit >>     She has persistent rhonchi in right base, left is clear & we rec adding Mucinex600Bid, Fluids, and use IS Qh at home; continue Advair500Bid...    BP is low today in light of her  weight loss & Metop50Bid; BP= 94/62 & we decided to decr the Metop50- 1/2Bid for now & encourage oral intake...    Known HH on Prilosec20/d and she remains asymptomatic; ?source of slow blood loss anemia? Hx diverticulitis & narrowing in sigmoid- see below- she states stools are soft, easy to pass, denies constip or blood... We reviewed prob list, meds, xrays and labs> see below for updates >>  LABS 7/14:  CBC- Hg=10.1, MCV=78;  Fe= 17 (8%sat);  Rec- monthly IV IRON DEXTRAN 1000mg  Qmo x 3 months w/ rov & labs after that...  ~  November 30, 2012:  25mo ROV & [name] is brought in by daugh, still lives at home, with grandson & daughter checks her  daily, 3 meals + Ensure supplements but despite all efforts she looks worse- weaker, decr musc mass, still pale, etc... She is weak but denies breathing difficult or cough/ sput/ etc & remains on Advair500Bid + Mucinex;  BP= 110/70 on Metop25Bid;  On Prilosec20/d & has some constip but not eating much & denies abd pain, n/v, swelling, diarrhea or blood seen...     She hurts all over "from head to toes" and winces when legs are moved; she is in wheelchair and can't/won't get up but daugh notes she can make it to the bathroom about once a day w/ help; she wears depends & they have been taking her to the commode every 4h or so to empty her bladder; on Tramadol50Tid but it doesn't seem to help much; Review of Hx shows known marked degen arthritis right hip back to before 2007, prev LLam 1989 by DrAplington, recurrent back & leg pain in 2000's w/ Ortho & NS evals recommending conservative approach since surg options would be too extensive; CXRs have shown wedge deformity of at least 2 lower vertebral bodies; she was referred to pain clinic DrKirshmayer; BMD f/u 2013 w/ Tscore -2.9 in left [facility], asked to restart fosamax but she stopped it again... NOTE: Alk Phos is wnl, and Sed Rate is elev at 96;  We discussed options for more extensive eval w/ XRays, MRI, refer to Ortho,  etc- but they decline & we opted for a trial of Pred to see if it makes a difference for her mobility & pain; depending on her response we broached the subject of Hospice care & comfort measures, eg-Duragesic...     Still demented but more spontaneous & participates in conversations; on Aricept10; she is not able to make decisions on her own behalf & daughter is doing a great job w/ her home care, support, medical decision making...     Last OV Hg=10.1 & Fe=17 (8%sat) after 1000mg  IV Iron 5/14; we decided to give IV Fe 1000mg  infusions x3 & f/u 41mo; she has received 2 of the 3 (3rd is sched for 2d from now) and labs today show Hg= 11.6, MCV= 80, Fe= 14 (8%sat); we need stool cards to check for on-going blood loss, continue IV Fe (1000mg  due in 2d, and order another 1000mg  in 2weeks w/ ROV after that...    Nutrition remains poor & deteriorating despite daugh efforts to get her to eat 3 meals + Ensure supplements; Alb is down to 2.5 today; on Vits, B12-1062mcg/d, & VitD-1000u/d; we discussed supervised meals w/ encouragement and Ensure or similar supplement Tid betw meals & Qhs, plus they agree w/ MEGACE for appetite- 40mg /ml 1tsp po Qid... We reviewed prob list, meds, xrays and labs> see below for updates >>  LABS 10/14:  Chems- ok x Na=131 Alb=2.5;  CBC- Hg= 11.6 Mcv=80, Wbc=12.5 Plat=857;  Fe=14 (8%sat);  TSH=3.07;  Sed=96;  CEA=0.8  Ca27.29=15...           Current Problems:   << ?ALLERGIC reaction to CIPRO in the past w/ hives, but she took cipro w/o problem 1/14 diverticulitis episode >>  ALLERGIC RHINITIS (ICD-477.9) - prev allergy eval yrs ago by DrESL & on shots for awhile. CHRONIC RHINITIS (ICD-472.0) - prev ENT eval 2005 by DrRosen for recurrent sinus infections, pressure in her head & HA's... he wanted to do Sinus CT but she declined... Rx w/ antihist/ saline/ Nasonex...  ASTHMA (ICD-493.90) - controlled on ADVAIR500 Bid; she no longer has a rescue inhaler; denies breathing difficulty.  Cough,  sputum, hemoptysis, etc... ~  CXR 5/07 (pre-op for breast surg) showed mild chronic changes, scarring left base, NAD.Marland Kitchen. ~  CXR 8/12 showed mod HH seen behind the heart, mild scarring vs atx left base, wedging of lower TSpine vert body... ~  12/12: went to ER w/ ?n/v/weakness; CXR- ?LLLinfiltrate vs scarring/atx; improved w/ ZPak & Amox... ~  1/14: single view CXR showed cardiomeg & vasc congestion, sm left effusion & basilar airsp dis... ~  7/14: we added Mucinex600Bid, fluids, IS Q1h while awake, and continue the Advair500Bid...  HYPERTENSION (ICD-401.9) - controlled on LOPRESSOR 50mg  Bid... she was seen by [facility] in the 1990's... ~  2DEcho 4/91 showed mild MVP w/ myxomatous ant leaflet & mild MR... ~  Myocardial Perfusion Scan 5/95 was WNL showing normal contractility & uptake in all myocardial segments. ~  1/13:  BP= 142/68 & she states asymptomatic w/o CP, palpit, SOB, edema, etc... ~  7/13:  BP= 126/64 & doing well overall she says... ~  EKG 1/14 showed NSR, rate69, NSSTTWA, NAD... ~  2/14:  BP= 116/70 & she is feeling better- denies CP, palpit, ch in SOB, edema, etc... ~  4/14:  on Metop50Bid; BP= 112/60 today & daugh monitors her meds at home; denies CP, palpit, dizzy, SOB, edema. ~  7/14: on Metop50Bid but she has lost weight down to 99# & BP= 94/62; we decided to decr Metop50 to 1/2Bid...  SUPRAVENTRICULAR TACHYCARDIA >> she developed a transient SVT 9/11 during her Hosp for anemia; subseq EKG showed NSR w/ PACs & minor NSSTTWA. ~  1/14:  EKG & monitor during hosp for diverticulitis & anemia showed NSR, rate69, NSSTTWA, NAD...   VENOUS INSUFFICIENCY (ICD-459.81) - she knows to avoid sodium, elevate legs, wear support hose when able...  HIATAL HERNIA (ICD-553.3) - she uses PRILOSEC 20mg /d...  ~  Old films showed RUQ clips from prior cholecystectomy... ~  EGD 11/06 by DrJHayes showed sm HH, otherw normal, no evid of Barrett's... ~  CXR 8/12 showed ?mod sized HH behind the heart;  ?source of her iron defic anemia? (only 2/18 stool cards pos for occult blood over the past yr). ~  1/14:  EGD by DrMagod (part of w/u for iron defic anemia) showed 3cmHH otherw neg...  IRRITABLE BOWEL SYNDROME (ICD-564.1) DIVERTICULOSIS OF COLON (ICD-562.10) - reminded to use Miralax/ Senakot-S as needed... ~  11/05: Hosp w/ diverticulitis & abscess> Rx'd w/ antibiotics & improved; DrStreck considered sigmoid colectomy but she improved w/ high fiber diet etc... ~  Colonoscopy 11/06 by [facility] showed divertic stricture in the rectosigmoid junction, scope passed only to the mid transverse colon...  ~  11/06: subseq BE showed innumerable divertics, some narrowing in the sigmoid region, otherw neg... ~  1/14:  Hosp for severe iron defic anemia & Colonoscopy by DrMagod could not advance past the rectosigm juction due to sharp angulation & narrowed segment beyond that;  Subsequent BE showed a segment of luminal narrowing in the distal sigmoid c/w acute diverticulitis- no leak or extravasation, no annular constricting lesion, no luminal masses or polypoid filling defects, mod diverticulosis seen in desc & sigmoid colon;  She was treated w/ antibiotics (IV Zosyn, then PO Cipro/Flagyl) and improved; We decided to sched a CTAbd&Pelvis=> improved  ~  4/14:  Patient stated no BM problems, stools are soft, no blood seen in stools, stools are dark but from the iron pt is currently taking.   Hx of BREAST CANCER (ICD-174.9) - off ARIMEDEX per DrRubin... she had an abn  mammogram in 2007 w/ subseq right lumpectomy & sentinel node bx by DrStreck pos for invasive ductal cancer- stage I... post op XRT from DrWu, and followed by DrRubin ever since then...   DEGENERATIVE ARTHRITIS >> ~  XRay of right hip 9/07 showed marked degenerative changes w/ narrowing, sclerosis, spur formation... ~  1/14: Abd film on adm for anemia/ diverticulitis showed severe degen changes in right hip, minor changes in left hip...  LOW BACK  PAIN SYNDROME (ICD-724.2) - she had lumbar surgery in 1989 by DrAplington w/ recurrent back and leg pain in 2006 & was evaluated by DrAplington & DrNudelman- he rec conservative management as the required surgery would be quite extensive... she saw DrKirshmayer in the pain management cliic as well... ~  MRI Lumbar Spine 9/07 showed degenerative lumbar spondylosis w/ assoc DDD & degen facet arthritis; multilevel spinal, lat recess, & foraminal stenoses at various levels... ~  CXR reports wedge deformity of lower thoracic vert, she denies any acute pain...  OSTEOPENIA (ICD-733.90) -  DrRubin had her on ALENDRONATE weekly + caltrate & MVI> but this was stopped ?when ?why... ~  BMD at [facility] by DrRubin 7/07 showed osteopenia w/ TScores -1.5 in Spine, & -1.3 in right hip... ~  2/13: f/u BMD==> TScores -1.2 in Spine, and -2.9 in left [facility]; she is rec to restart the FOSAMAX 70mg  Qwk=> she stopped again... ~  4/14:  Denies pain x mild left knee pain at times. ?memory. Has Tramadol that she uses off and on when needed.  DEMENTIA >> on ARICEPT 10mg /d... ~  CT Brain 9/11 by the hospitalists showed generalized atrophy, chronic small vessel disease, NAD (no mets), chronic sinusitis... ~  CT Brain 1/12 by DrPlovsky showed some atrophy, small vessel ischemic disease, no acute infarcts; there was also evid of extensive chronic sinusitis ~  1/13: she seems better- brighter, more spontaneous, etc... ~  CT Brain 12/13 at [facility] showed atrophy/ sm vessel dis/ NAD...  ANEMIA >> Fe-deficient anemia on FeSO4 325mg /d... ~  [facility] 9/11 w/ severe microcytic anemia ?etiology; Hg=5.3, MCV=60; Fe/Tibc weren't done; transfused ?units of ABpos blood & final Hg=11.4, MCV=71... ~  In the [facility] 9/11 her B12 level was 188 & she was given B12 shots w/o further eval; B12 level improved to 765 w/ the shots... ~  She saw DrLowne in the office 10/11 & was sched for B12 shots to start but she never returned... ~  Starting in 2/12 she  was re-eval by [facility] w/ Hg=8.4, she no showed/ cancelled/ refused several attempts to sched EGD/ Colon & Hg dropped to 7.7 then 6.3; she was transfused as outpt then lost to f/u until her 8/12 OV w/ here... ~  Labs 8/12 showed Hg=10.5, MCV=73, Fe=not done> we decided on FeSO4 325mg /d w/ Vit C 500mg /d ~  Labs 12/12 showed Hg= 12.5, MCV=85, & she remains on Fe daily... ~  Presented 1/14 w/ extreme pallor, weakness, dyspnea; Hg=4.2, Fe<10, adm by [facility]=> transfused, oral Iron supplementation, & Hg improved to 9.4 at disch. ~  2/14: pt returns for post hosp visit> feeling better s/p treatment for diverticulitis & transfusion for anemia; labs today showed Hg= 11.9, MCV=74, Fe=30 ~  4/14: labs today showed Hg= 11.3 and Fe= 15.  Will set up for IV iron infusion at short stay => given 1000mg  IV Iron Dextran 5/14... ~  Labs 7/14 showed CBC- Hg=10.1, MCV=78;  Fe= 17 (8%sat);  Rec- monthly IV IRON DEXTRAN 1000mg  Qmo x 3 months w/ rov & labs  after that...  VITAMIN B12 Deficiency >> asked to take B12 OTC oral supplement ~  Labs in hosp 9/11 showed Vit B12 level = 188; she was immed started on shots by the hospitalists; f/u B12 level 10/11 was 765 & DrLowne planned monthly shots but she never showed up... ~  Labs here 8/12 showed B12 level = 242 and we decided to supplement orally daily for now... ~  Labs 1/14 in hosp showed B12 level = 628; Rec to continue oral supplement...   Past Surgical History  Procedure Laterality Date  . Appendectomy  1950;s  . Right inguinal hernia repair    . Lumbar laminectomy  1989    Dr, Simonne Come  . Laparoscopic cholecystectomy  1992    Dr. Orson Slick  . Right breast lumpectomy/sentinel node bx  06/2005    Dr. Jamey Ripa  . Colonoscopy  03/05/2012    Procedure: COLONOSCOPY;  Surgeon: Barrie Folk, MD;  Location: [facility] ENDOSCOPY;  Service: Endoscopy;  Laterality: N/A;  . Esophagogastroduodenoscopy  03/05/2012    Procedure: ESOPHAGOGASTRODUODENOSCOPY (EGD);  Surgeon:  Barrie Folk, MD;  Location: [facility] ENDOSCOPY;  Service: Endoscopy;  Laterality: N/A;    Outpatient Encounter Prescriptions as of 11/30/2012  Medication Sig Dispense Refill  . Calcium Carbonate-Vitamin D (CALTRATE 600+D) 600-400 MG-UNIT per tablet Take 1 tablet by mouth 2 (two) times daily.        . cholecalciferol (VITAMIN D) 1000 UNITS tablet Take 1,000 Units by mouth daily.        Marland Kitchen donepezil (ARICEPT) 10 MG tablet Take 1 tablet (10 mg total) by mouth at bedtime.  30 tablet  11  . ferrous sulfate 325 (65 FE) MG tablet Take 325 mg by mouth daily with breakfast.      . Fluticasone-Salmeterol (ADVAIR) 500-50 MCG/DOSE AEPB Inhale 1 puff into the lungs every 12 (twelve) hours.      Marland Kitchen guaiFENesin (MUCINEX) 600 MG 12 hr tablet Take 600 mg by mouth 2 (two) times daily.      . metoprolol (LOPRESSOR) 50 MG tablet Take 1/2 tablet by mouth two times daily      . omeprazole (PRILOSEC) 20 MG capsule TAKE ONE CAPSULE BY MOUTH EVERY DAY  30 capsule  2  . ondansetron (ZOFRAN) 4 MG tablet Take 1 tablet (4 mg total) by mouth every 8 (eight) hours as needed for nausea.  20 tablet  0  . traMADol (ULTRAM) 50 MG tablet TAKE 1 TABLET (50 MG TOTAL) BY MOUTH 3 (THREE) TIMES DAILY AS NEEDED FOR PAIN.  90 tablet  1  . vitamin B-12 (CYANOCOBALAMIN) 1000 MCG tablet Take 1,000 mcg by mouth daily.       No facility-administered encounter medications on file as of 11/30/2012.    Allergies  Allergen Reactions  . Levofloxacin     REACTION: dizziness    Current Medications, Allergies, Past Medical History, Past Surgical History, Family History, and Social History were reviewed in [employer] record.   Review of Systems       See HPI - all other systems neg except as noted...       The patient complains of some weakness, & dyspnea on exertion.  The patient denies anorexia, fever, weight loss, weight gain, vision loss, decreased hearing, hoarseness, chest pain, syncope, peripheral edema, prolonged  cough, headaches, hemoptysis, abdominal pain, melena, hematochezia, severe indigestion/heartburn, hematuria, incontinence, muscle weakness, suspicious skin lesions, transient blindness, difficulty walking, depression, unusual weight change, abnormal bleeding, enlarged lymph nodes, and angioedema.  Objective:   Physical Exam    WD, thin & sl pale, 79 y/o WF in NAD... she is chronically ill appearing. GENERAL:  Alert & mostly cooperative, obvious dementia/ some confusion. HEENT:  [place]/AT, EOM-wnl, PERRLA, EACs-clear, TMs-wnl, NOSE- sl congested, THROAT-clear & wnl. NECK:  Supple w/ fairROM; no JVD; normal carotid impulses w/o bruits; no thyromegaly or nodules palpated; no lymphadenopathy. CHEST:  Clear to P & A; without wheezes or rales- few rhonchi scattered bilat... HEART:  Regular Rhythm; without murmurs/ rubs/ or gallops heard... ABDOMEN:  Soft & nontender; normal bowel sounds; no organomegaly or masses detected. EXT: without deformities, mild arthritic changes; no varicose veins/ +venous insuffic/ no edema. NEURO:  CN's intact; motor testing normal; sensory testing normal; gait normal & balance OK. DERM:  No lesions noted; no rash etc...  MMSE 10/02/10 >> Date 2012 Aug ?day; she didn't know the Pres/ VP/ Gov/ or prev administration; remembered 3/3 objects immed but 0/3 after distraction; serial 7's= 100-93-86-7?; WORLD backwards= DLROW; proverbs were mixed- one abstracted (glass houses), others concrete (stitch, etc)...   RADIOLOGY DATA:  Reviewed in the EPIC EMR & discussed w/ the patient & daughter...  LABORATORY DATA:  Reviewed in the EPIC EMR & discussed w/ the patient & daughter...   Assessment & Plan:    This continues to be a very difficult medical situation> Breathing, BP, CV- all seem to be stable, but she has poor appetite, poor intake, wt loss, prot-cal malnutrition, musc wasting, poor mobility to the point that she can't/ won't get up, wears depends, etc;  For her part pt is  c/o hurting all over & she winces w/ passive ROM of legs, won't get out of wheelchair etc;  She does not want more aggressive eval & Rx preferring comfort measures as she is in a lot of pain;  We decided to check labs & give her a trial PREDNISONE 20mg /d...    Asthma> this has not been an issue but had RLL atx & effusion in past; she has been maintained on Advair, Mucinex, & we prev added IS etc...  HBP> BP is good on Metop50- 1/2 Bid...  HH seen on CXR & EGD 1/14> may be source of GI blood loss & her iron defic anemia; on OMEPRAZOLE 20mg /d at present...  IBS, Divertics w/ stricture in sigmoid>  See 2005 hosp for diverticulitis, then subseq work up by [facility] in 2006;  Subseq adm 1/14 for severe IDA & eval including Colonoscopy attempt & BE showed diverticulitis in sigmoid w/ narrowed segment etc;  Clinically improved w/ Cipro/Flagyl Rx; CT Abd&Pelvis as noted above...  BREAST CANCER>  Stage 1 dis w/ Lumpectomy, sentinel node bx, XRT, then Arimidex per DrRubin; she prev stopped the Arimidex & stopped her f/u w/ Oncology...  Osteopenia & ?compression fx ~L1 on CXR> off Alendronate again, (f/u BMD 2/13 w/ TScore -2.9 in left FemNeck & Alendronate restarted)... She also has severe degen arthritis in right hip & we discussed trial TRAMADOL + TYLENOL for the pain...  Hx Anemia> ?etiology (never determined w/ certainty) but most likely her mod HH seen on CXR (prev EGD/ Colon 2006 showed smHH, divertics w/ stricture, no lesions seen); Hg 12/12= 12.5, MCV=85==> continue the Fe daily...   Iron has remained low despite IV Iron infusions; Hg and MCV has improved; she has not wanted more aggressive eval; we will continue IV Fe & check stool for hidden blood...  Vit B12 defic> it was 188 in hosp 9/11 & started on B12 shots  immed by the hospitalists; f/u level 765 shortly after disch but she never ret for continued shots; B12 level now 242 & we discussed trial of oral supplementation taking po  daily...  Dementia> she is seeing DrPlovsky for Psyche> on Aricept 10mg /d...  Other medical problems as noted> aske to continue Calcium, Women's MVI, Vit D 1000u daily & B-12 1075mcg/d oral supplements...   Patient's Medications  New Prescriptions   MEGESTROL (MEGACE) 40 MG/ML SUSPENSION    Take 5 mLs (200 mg total) by mouth 4 (four) times daily.   PREDNISONE (DELTASONE) 20 MG TABLET    Take 1 tablet (20 mg total) by mouth daily.  Previous Medications   CALCIUM CARBONATE-VITAMIN D (CALTRATE 600+D) 600-400 MG-UNIT PER TABLET    Take 1 tablet by mouth 2 (two) times daily.     CHOLECALCIFEROL (VITAMIN D) 1000 UNITS TABLET    Take 1,000 Units by mouth daily.     DONEPEZIL (ARICEPT) 10 MG TABLET    Take 1 tablet (10 mg total) by mouth at bedtime.   FERROUS SULFATE 325 (65 FE) MG TABLET    Take 325 mg by mouth daily with breakfast.   FLUTICASONE-SALMETEROL (ADVAIR) 500-50 MCG/DOSE AEPB    Inhale 1 puff into the lungs every 12 (twelve) hours.   GUAIFENESIN (MUCINEX) 600 MG 12 HR TABLET    Take 600 mg by mouth 2 (two) times daily.   METOPROLOL (LOPRESSOR) 50 MG TABLET    Take 1/2 tablet by mouth two times daily   OMEPRAZOLE (PRILOSEC) 20 MG CAPSULE    TAKE ONE CAPSULE BY MOUTH EVERY DAY   ONDANSETRON (ZOFRAN) 4 MG TABLET    Take 1 tablet (4 mg total) by mouth every 8 (eight) hours as needed for nausea.   TRAMADOL (ULTRAM) 50 MG TABLET    TAKE 1 TABLET (50 MG TOTAL) BY MOUTH 3 (THREE) TIMES DAILY AS NEEDED FOR PAIN.   VITAMIN B-12 (CYANOCOBALAMIN) 1000 MCG TABLET    Take 1,000 mcg by mouth daily.  Modified Medications   No medications on file  Discontinued Medications   No medications on file

## 2012-12-01 ENCOUNTER — Other Ambulatory Visit: Payer: Self-pay | Admitting: Pulmonary Disease

## 2012-12-01 DIAGNOSIS — D508 Other iron deficiency anemias: Secondary | ICD-10-CM

## 2012-12-01 MED ORDER — SODIUM CHLORIDE 0.9 % IV SOLN: 1000.0000 mg | Freq: Once | INTRAVENOUS | Status: DC

## 2012-12-02 ENCOUNTER — Other Ambulatory Visit (HOSPITAL_COMMUNITY): Payer: Self-pay | Admitting: Pulmonary Disease

## 2012-12-02 ENCOUNTER — Encounter (HOSPITAL_COMMUNITY)
Admission: RE | Admit: 2012-12-02 | Discharge: 2012-12-02 | Disposition: A | Discharge status: Home / Self Care | Payer: Medicare Other | Source: Ambulatory Visit | Attending: Pulmonary Disease | Admitting: Pulmonary Disease

## 2012-12-02 ENCOUNTER — Encounter (HOSPITAL_COMMUNITY): Payer: Self-pay

## 2012-12-02 DIAGNOSIS — D508 Other iron deficiency anemias: Secondary | ICD-10-CM | POA: Insufficient documentation

## 2012-12-02 MED ORDER — SODIUM CHLORIDE 0.9 % IV SOLN
Freq: Once | INTRAVENOUS | Status: AC
  Administered 2012-12-02: 08:00:00 via INTRAVENOUS

## 2012-12-02 MED ORDER — SODIUM CHLORIDE 0.9 % IV SOLN
1000.0000 mg | Freq: Once | INTRAVENOUS | Status: AC
  Administered 2012-12-02: 1000 mg via INTRAVENOUS
  Filled 2012-12-02: qty 20

## 2012-12-02 NOTE — Progress Notes (Signed)
Iron infusion completed at 1500. Waiting for daughter or grandson to pick up. No signs, symptoms or complaints of adverse reaction noted. Patient and grandson were instructed to call Dr. Jodelle Green office for problems once she is discharged from Short Stay. Both verbalized understanding.

## 2012-12-16 ENCOUNTER — Encounter (HOSPITAL_COMMUNITY): Admission: RE | Admit: 2012-12-16 | Payer: Medicare Other | Source: Ambulatory Visit

## 2012-12-16 ENCOUNTER — Telehealth: Payer: Self-pay | Admitting: Pulmonary Disease

## 2012-12-16 DIAGNOSIS — D508 Other iron deficiency anemias: Secondary | ICD-10-CM

## 2012-12-16 NOTE — Telephone Encounter (Signed)
i called and lmomtcb  for [name]---pts daughter to see when we can reschedule pts iron infusion.

## 2012-12-16 NOTE — Telephone Encounter (Signed)
Will forward to SN as an FYI 

## 2012-12-17 NOTE — Telephone Encounter (Signed)
First available appointment to get patient in for Iron Infusion is Thurs. 12/30/12 at 9:00. Pt will need to arrive at 8:45 and check in at admissions at [facility]. If this appointment will not work for the granddaughter have her contact Dee at [facility] Short Stay to r/s appointment to when she will be able to take her. Order has been faxed.  Dee at [facility] Short Stay phone # [phone] to r/s above appointment if this is not convenient for the granddaughter. Rhonda J Cobb

## 2012-12-17 NOTE — Telephone Encounter (Signed)
I spoke with [facility] from Short stay. She reports they will need new orders and fax them over.  lmtcb x1 for kim

## 2012-12-17 NOTE — Telephone Encounter (Signed)
Called and spoke with patient's daughter, [name] (not granddaughter). Daughter stated that her mother was not feeling well and she didn't want to go to the appointment and [name] stated that she was at work. Gave her the new appointment of Thurs. 12/30/12 at 9:00. Advised her to have the patient at [facility] admissions at 8:45 for 9:00 appointment. Gave her [facility] Short Stay phone number in case this appointment is not convenient for her or patient that she can call and r/s. Phone # [phone]. Rhonda J Cobb

## 2012-12-17 NOTE — Telephone Encounter (Signed)
Order for short stay has been placed by mindy and rhonda will fax a new order to short stay for the iron infusion.  Waiting for pts daughter to call me back to set this up again for her.

## 2012-12-24 ENCOUNTER — Ambulatory Visit (INDEPENDENT_AMBULATORY_CARE_PROVIDER_SITE_OTHER): Payer: Medicare Other | Admitting: Pulmonary Disease

## 2012-12-24 ENCOUNTER — Encounter: Payer: Self-pay | Admitting: Pulmonary Disease

## 2012-12-24 VITALS — BP 160/82 | HR 85 | Temp 98.2°F

## 2012-12-24 DIAGNOSIS — M1990 Unspecified osteoarthritis, unspecified site: Secondary | ICD-10-CM

## 2012-12-24 DIAGNOSIS — D508 Other iron deficiency anemias: Secondary | ICD-10-CM

## 2012-12-24 DIAGNOSIS — M810 Age-related osteoporosis without current pathological fracture: Secondary | ICD-10-CM

## 2012-12-24 DIAGNOSIS — I10 Essential (primary) hypertension: Secondary | ICD-10-CM

## 2012-12-24 DIAGNOSIS — M79609 Pain in unspecified limb: Secondary | ICD-10-CM

## 2012-12-24 DIAGNOSIS — M545 Low back pain: Secondary | ICD-10-CM

## 2012-12-24 DIAGNOSIS — F068 Other specified mental disorders due to known physiological condition: Secondary | ICD-10-CM

## 2012-12-24 DIAGNOSIS — J45909 Unspecified asthma, uncomplicated: Secondary | ICD-10-CM

## 2012-12-24 DIAGNOSIS — M25569 Pain in unspecified knee: Secondary | ICD-10-CM

## 2012-12-24 DIAGNOSIS — M79604 Pain in right leg: Secondary | ICD-10-CM

## 2012-12-24 DIAGNOSIS — C50919 Malignant neoplasm of unspecified site of unspecified female breast: Secondary | ICD-10-CM

## 2012-12-24 NOTE — Progress Notes (Signed)
Subjective:    Patient ID: [name], female    DOB: Jun 27, 1933, 79 y.o.   MRN: [id number]  HPI 79 y/o WF w/ multiple medical problems as noted below & she also sees DrJHayes/Magod for GI, DrRKaplan for GYN, and DrRubin for Breast Cancer... SEE PREV EPIC NOTES FOR THE OLDER VISITS >>   ~  August 28, 2011:  38mo ROV & Idil is basically stable, c/o some back discomfort today, no known trauma etc & we rec Rest, Heat, Tylenol, Advil, etc... She had BMD rechecked 2/13 w/ TScore -2.9 in left FemNeck & FOSAMAX 70mg /wk restarted... She is eating better & weight up 5# to 137# today...  We reviewed prob list, meds, xrays and labs> see below for updates >>  ~  March 02, 2012:  38mo ROV & [name] is incredibly pale and dyspneic w/ min activity; brought in by her daughter who says she didn't notice color change but mom has been c/o nausea & not eating well;  Pt denies abd pain or any pain really, she is nauseated but no vomiting; pt states that BMs are normal consistency, brown (not black) & no red blood seen;  Chart indicates 14# wt loss in 38mo to 122# today (daugh states she's not eating);  Hemodynamically stable- BP 100/60 sitting, pulse= 70 regular, RR= 20 & not labored at rest but incr dyspnea walking to bathroom...  Last labs in EPIC showed Hg=12.5 ~1 year ago...  DrJHayes, [facility] is her gastroenterologist- SEE BELOW for details: she had EGD & Colon/BE in 2006 and she refused f/u studies during a similar presentation in 2011...    We reviewed prob list, meds, xrays and labs>> I discussed case w/ ED physician & we will send to [facility] ER for admission by [facility]...  ~  April 08, 2012:  19mo ROV & post hosp check> [name] was adm 1/21 - 03/07/12 by [facility] w/ severe Fe defic anemia (Hg=4.2, MCV=55, Fe<10) yet she denied abd pain & did not note any change in her stools (brown- weakly heme pos); she was transfused 3u & continued on her oral Fe (no IV Iron was used)- Hg at disch was 9.4;  Eval in the hosp included  work up by [employer] for GI> EGD showed 3cmHH, otherw normal;  Colonoscopy could not advance past the rectosigm juction due to sharp angulation & narrowed segment beyond that;  Subsequent BE showed a segment of luminal narrowing in the distal sigmoid c/w acute diverticulitis- no leak or extravasation, no annular constricting lesion, no luminal masses or polypoid filling defects, mod diverticulosis seen in desc & sigmoid colon;  She was treated w/ antibiotics (IV Zosyn, then PO Cipro/Flagyl) and improved;  She is to f/u w/ DrMagod- we discussed checking LABS today & sched a CTAbd&Pelvis... LABS 2/14:  Chems- ok w/ BS=120, K=3.5, BUN/Creat=normal;  CBC- improved w/ Hg=11.9, MCV=74, Fe=30 (12%)... CT Abd&Pelvis => done 3/14 & showed modHH, GB clips, mod atheromatous calcif in Ao, divertics, short segm rectosigmoid wall thickening, hip degen changes & lumbar sp degen... (placed on Fe, VitC, Miralax, Senakot-S)  ~  June 07, 2012:  51mo ROV & [name] is c/o weak feeling in her legs when walking, trouble getting up and down, and she uses a cane/walker to ambulate, but not exercising much; we offered more therapy at home but the patient declined...    Asthma/ ?COPD> on Advair500Bid; stable w/o resp exac despite allergy season; denies cough, sput, hemoptysis, SOB, wheezing, etc...    HBP> on Metop50Bid;  BP= 112/60 today & daugh monitors her meds at home; denies CP, palpit, dizzy, SOB, edema...    HH> on Prilosec20/d, Zofran4prn; prev n/v resolved, denies choking episode/ trouble swallowing/ abd pain/ etc...    Divertics/IBS> she has known divertics & narrowing in sigmoid area; reminded to stay on Miralax/ Senakot-S/ etc to prevent recurrent diverticulitis...    Hx Breast Cancer> prev followed by DrRubin; she denies breast lesions- last Mammogram 4/10 was neg; she is not inclined to do f/u studies...    DJD, LBP> known degen lumbar spondylosis etc; part compression fx seen in lower thor vert on CXR; she denies back pain  etc & has Tramadol for prn use; as noted above she is weak & debilitated, refuses to do home exercises, sits all day & can barely get up and walk w/ her cane/ walker; she declines offer for more home PT...    Osteopenia> due for f/u BMD & consideration of Bisphos therapy again; reminded to take Calcium, MVI, VitD...    Dementia> followed by DrPlovsky on Aricept10 & she has stabilized/ improved...    Anemia> etiology never fully determined but likely related to GI pathology- large HH & rectosig changes; on FeSO4 daily & Hg=11.3 w/ Fe=15 (5%)- we will Rx w/ 1000mg  IV iron dextran & repeat labs in 50mo... We reviewed prob list, meds, xrays and labs> see below for updates >>  LABS 4/15:  Chems= WNL;  LFT's ok x Ab= 2.8-advised to start supplements daily(boost, ensure,ect);  Hg=11.3; Fe= low and recs for IV iron dextrose infusion.    ~  September 06, 2012:  85mo ROV & recheck> when last seen [name] was getting weaker, she refused more PT, labs showed Hg=11.3 but Fe=15 (5%sat) while on oral Fe for 62mo so we decided to give IV Imferon 1000mg  given 5/14 & tol well; she did not ret to the lab for f/u blood work in June so we are checking CBC/ Fe today;  Unfortunately [name] has lost weight despite a fair appetite "I get hungry" and meals per daughter + supplements- hard to know how much she is really consuming & daughter will monitor this more closely... Weight today= 99 lbs & last weight here was 116# Feb2014; offered Megace but she declines, neither does she want nutritional counseling- advised of the very neg prognostic implications for underweight status & they understand... We did not have time for completion of MOST Form today but we will do so on return visit...  We reviewed the following medical problems during today's office visit >>     She has persistent rhonchi in right base, left is clear & we rec adding Mucinex600Bid, Fluids, and use IS Qh at home; continue Advair500Bid...    BP is low today in light of her  weight loss & Metop50Bid; BP= 94/62 & we decided to decr the Metop50- 1/2Bid for now & encourage oral intake...    Known HH on Prilosec20/d and she remains asymptomatic; ?source of slow blood loss anemia? Hx diverticulitis & narrowing in sigmoid- see below- she states stools are soft, easy to pass, denies constip or blood... We reviewed prob list, meds, xrays and labs> see below for updates >>  LABS 7/14:  CBC- Hg=10.1, MCV=78;  Fe= 17 (8%sat);  Rec- monthly IV IRON DEXTRAN 1000mg  Qmo x 3 months w/ rov & labs after that...  ~  November 30, 2012:  85mo ROV & Buffie is brought in by daugh, still lives at home, with grandson & daughter checks her  daily, 3 meals + Ensure supplements but despite all efforts she looks worse- weaker, decr musc mass, still pale, etc... She is weak but denies breathing difficult or cough/ sput/ etc & remains on Advair500Bid + Mucinex;  BP= 110/70 on Metop25Bid;  On Prilosec20/d & has some constip but not eating much & denies abd pain, n/v, swelling, diarrhea or blood seen...     She hurts all over "from head to toes" and winces when legs are moved; she is in wheelchair and can't/won't get up but daugh notes she can make it to the bathroom about once a day w/ help; she wears depends & they have been taking her to the commode every 4h or so to empty her bladder; on Tramadol50Tid but it doesn't seem to help much; Review of Hx shows known marked degen arthritis right hip back to before 2007, prev LLam 1989 by DrAplington, recurrent back & leg pain in 2000's w/ Ortho & NS evals recommending conservative approach since surg options would be too extensive; CXRs have shown wedge deformity of at least 2 lower vertebral bodies; she was referred to pain clinic DrKirshmayer; BMD f/u 2013 w/ Tscore -2.9 in left [facility], asked to restart fosamax but she stopped it again... NOTE: Alk Phos is wnl, and Sed Rate is elev at 96;  We discussed options for more extensive eval w/ XRays, MRI, refer to Ortho,  etc- but they decline & we opted for a trial of Pred to see if it makes a difference for her mobility & pain; depending on her response we broached the subject of Hospice care & comfort measures, eg-Duragesic...     Still demented but more spontaneous & participates in conversations; on Aricept10; she is not able to make decisions on her own behalf & daughter is doing a great job w/ her home care, support, medical decision making...     Last OV Hg=10.1 & Fe=17 (8%sat) after 1000mg  IV Iron 5/14; we decided to give IV Fe 1000mg  infusions x3 & f/u 37mo; she has received 2 of the 3 (3rd is sched for 2d from now) and labs today show Hg= 11.6, MCV= 80, Fe= 14 (8%sat); we need stool cards to check for on-going blood loss, continue IV Fe (1000mg  due in 2d, and order another 1000mg  in 2weeks w/ ROV after that...    Nutrition remains poor & deteriorating despite daugh efforts to get her to eat 3 meals + Ensure supplements; Alb is down to 2.5 today; on Vits, B12-1050mcg/d, & VitD-1000u/d; we discussed supervised meals w/ encouragement and Ensure or similar supplement Tid betw meals & Qhs, plus they agree w/ MEGACE for appetite- 40mg /ml 1tsp po Qid... We reviewed prob list, meds, xrays and labs> see below for updates >>  LABS 10/14:  Chems- ok x Na=131 Alb=2.5;  CBC- Hg= 11.6 Mcv=80, Wbc=12.5 Plat=857;  Fe=14 (8%sat);  TSH=3.07;  Sed=96;  CEA=0.8  Ca27.29=15...  ~  December 24, 2012:  3wk ROV & Shayonna has been on the Pred20/d since the last visit & is perhaps sl better (daugh thinks some better, more spontaneous, mood improved, able to up & down sl better, still wearing depends) but still in mod pain & difficulty ambulating etc;  They did not fill the Megace (eating sl better on the Pred)- still not able to stand in office for weight;  They did not collect the stool cards for analysis & will do so ASAP at my request;  She has also [facility]- only had one infusion of 1000mg   Fe (on 12/02/12) and the second one is sched to be given next wk (12/30/12);  We decided to plan an additional 1000mg  IV Iron dextran at end of December (this will make 4 monthly infusions from Aug-Dec) & we will recheck pt in mid Jan2015 w/ an eye towards continuing the monthly infusions until her Iron level is repleted...     She is still c/o pain all over esp in legs and right hip- exam shows mostly pain in knees w/ active/passive ROM- I convinced them to obtain an Orthopedic eval w/ XRays etc to see if anything else can be done to help her chronic pain situation...     She has been drinking Boost & similar supplements; we will need to recheck labs w/ alb level later to see if this is nutritionally effective...  We reviewed prob list, meds, xrays and labs> see below for updates >>  We will give 1000mg  IV Iron dextran 11/20 & again towards end of Dec- plan ROV [id number] w/ labs...           Current Problems:   << ?ALLERGIC reaction to CIPRO in the past w/ hives, but she took cipro w/o problem 1/14 diverticulitis episode >>  ALLERGIC RHINITIS (ICD-477.9) - prev allergy eval yrs ago by DrESL & on shots for awhile. CHRONIC RHINITIS (ICD-472.0) - prev ENT eval 2005 by DrRosen for recurrent sinus infections, pressure in her head & HA's... he wanted to do Sinus CT but she declined... Rx w/ antihist/ saline/ Nasonex...  ASTHMA (ICD-493.90) - controlled on ADVAIR500 Bid; she no longer has a rescue inhaler; denies breathing difficulty. Cough, sputum, hemoptysis, etc... ~  CXR 5/07 (pre-op for breast surg) showed mild chronic changes, scarring left base, NAD.Marland Kitchen. ~  CXR 8/12 showed mod HH seen behind the heart, mild scarring vs atx left base, wedging of lower TSpine vert body... ~  12/12: went to ER w/ ?n/v/weakness; CXR- ?LLLinfiltrate vs scarring/atx; improved w/ ZPak & Amox... ~  1/14: single view CXR showed cardiomeg & vasc congestion, sm left effusion & basilar airsp dis... ~  7/14: we added  Mucinex600Bid, fluids, IS Q1h while awake, and continue the Advair500Bid...  HYPERTENSION (ICD-401.9) - controlled on LOPRESSOR 50mg  Bid... she was [facility] by [facility] in the 1990's... ~  2DEcho 4/91 showed mild MVP w/ myxomatous ant leaflet & mild MR... ~  Myocardial Perfusion Scan 5/95 was WNL showing normal contractility & uptake in all myocardial segments. ~  1/13:  BP= 142/68 & she states asymptomatic w/o CP, palpit, SOB, edema, etc... ~  7/13:  BP= 126/64 & doing well overall she says... ~  EKG 1/14 showed NSR, rate69, NSSTTWA, NAD... ~  2/14:  BP= 116/70 & she is feeling better- denies CP, palpit, ch in SOB, edema, etc... ~  4/14:  on Metop50Bid; BP= 112/60 today & daugh monitors her meds at home; denies CP, palpit, dizzy, SOB, edema. ~  7/14: on Metop50Bid but she has lost weight down to 99# & BP= 94/62; we decided to decr Metop50 to 1/2Bid... ~  11/14: on Metop50-1/2Bid & BP= 160/80 but she is in pain; denies CP, palpit, ch in SOB, edema...  SUPRAVENTRICULAR TACHYCARDIA >> she developed a transient SVT 9/11 during her Hosp for anemia; subseq EKG showed NSR w/ PACs & minor NSSTTWA. ~  1/14:  EKG & monitor during hosp for diverticulitis & anemia showed NSR, rate69, NSSTTWA, NAD...   VENOUS INSUFFICIENCY (ICD-459.81) - she knows to  avoid sodium, elevate legs, wear support hose when able...  HIATAL HERNIA (ICD-553.3) - she uses PRILOSEC 20mg /d...  ~  Old films showed RUQ clips from prior cholecystectomy... ~  EGD 11/06 by DrJHayes showed sm HH, otherw normal, no evid of Barrett's... ~  CXR 8/12 showed ?mod sized HH behind the heart; ?source of her iron defic anemia? (only 2/18 stool cards pos for occult blood over the past yr). ~  1/14:  EGD by DrMagod (part of w/u for iron defic anemia) showed 3cmHH otherw neg...  IRRITABLE BOWEL SYNDROME (ICD-564.1) DIVERTICULOSIS OF COLON (ICD-562.10) - reminded to use Miralax/ Senakot-S as needed... ~  11/05: Hosp w/ diverticulitis & abscess> Rx'd w/  antibiotics & improved; DrStreck considered sigmoid colectomy but she improved w/ high fiber diet etc... ~  Colonoscopy 11/06 by [facility] showed divertic stricture in the rectosigmoid junction, scope passed only to the mid transverse colon...  ~  11/06: subseq BE showed innumerable divertics, some narrowing in the sigmoid region, otherw neg... ~  1/14:  Hosp for severe iron defic anemia & Colonoscopy by DrMagod could not advance past the rectosigm juction due to sharp angulation & narrowed segment beyond that;  Subsequent BE showed a segment of luminal narrowing in the distal sigmoid c/w acute diverticulitis- no leak or extravasation, no annular constricting lesion, no luminal masses or polypoid filling defects, mod diverticulosis seen in desc & sigmoid colon;  She was treated w/ antibiotics (IV Zosyn, then PO Cipro/Flagyl) and improved; We decided to sched a CTAbd&Pelvis=> improved  ~  4/14:  Patient stated no BM problems, stools are soft, no blood seen in stools, stools are dark but from the iron pt is currently taking.  ~  Pt & daugh asked to collect consecutive stool samples to check for occult blood => pending...  Hx of BREAST CANCER (ICD-174.9) - off ARIMEDEX per DrRubin... she had an abn mammogram in 2007 w/ subseq right lumpectomy & sentinel node bx by DrStreck pos for invasive ductal cancer- stage I... post op XRT from DrWu, and followed by DrRubin ever since then...   DEGENERATIVE ARTHRITIS >> ~  XRay of right hip 9/07 showed marked degenerative changes w/ narrowing, sclerosis, spur formation... ~  1/14: Abd film on adm for anemia/ diverticulitis showed severe degen changes in right hip, minor changes in left hip... ~  10/14 & 11/14:  She is c/o severe pain in both legs from knees down; exam shows pain w/ ROM knees & right hip; trial Pred20 => sl better but not markedly so, we decided to contrinue Pred20 for now 7 refer to Ortho to see if there is anything they can do...  LOW BACK PAIN  SYNDROME (ICD-724.2) - she had lumbar surgery in 1989 by DrAplington w/ recurrent back and leg pain in 2006 & was evaluated by DrAplington & DrNudelman- he rec conservative management as the required surgery would be quite extensive... she saw DrKirshmayer in the pain management cliic as well... ~  MRI Lumbar Spine 9/07 showed degenerative lumbar spondylosis w/ assoc DDD & degen facet arthritis; multilevel spinal, lat recess, & foraminal stenoses at various levels... ~  CXR reports wedge deformity of lower thoracic vert, she denies any acute pain...  OSTEOPENIA (ICD-733.90) -  DrRubin had her on ALENDRONATE weekly + caltrate & MVI> but this was stopped ?when ?why... ~  BMD at [facility] by DrRubin 7/07 showed osteopenia w/ TScores -1.5 in Spine, & -1.3 in right hip... ~  2/13: f/u BMD==> TScores -1.2 in Spine, and -  2.9 in left [facility]; she is rec to restart the [facility] 70mg  Qwk=> she stopped again... ~  4/14:  Denies pain x mild left knee pain at times. ?memory. Has Tramadol that she uses off and on when needed.  DEMENTIA >> on ARICEPT 10mg /d... ~  CT Brain 9/11 by the hospitalists showed generalized atrophy, chronic small vessel disease, NAD (no mets), chronic sinusitis... ~  CT Brain 1/12 by DrPlovsky showed some atrophy, small vessel ischemic disease, no acute infarcts; there was also evid of extensive chronic sinusitis ~  1/13: she seems better- brighter, more spontaneous, etc... ~  CT Brain 12/13 at [facility] showed atrophy/ sm vessel dis/ NAD...  ANEMIA >> Fe-deficient anemia on FeSO4 325mg /d... ~  [facility] 9/11 w/ severe microcytic anemia ?etiology; Hg=5.3, MCV=60; Fe/Tibc weren't done; transfused ?units of ABpos blood & final Hg=11.4, MCV=71... ~  In the [facility] 9/11 her B12 level was 188 & she was given B12 shots w/o further eval; B12 level improved to 765 w/ the shots... ~  She saw DrLowne in the office 10/11 & was sched for B12 shots to start but she never returned... ~  Starting in 2/12 she was  re-eval by [facility] w/ Hg=8.4, she no showed/ cancelled/ refused several attempts to sched EGD/ Colon & Hg dropped to 7.7 then 6.3; she was transfused as outpt then lost to f/u until her 8/12 OV w/ here... ~  Labs 8/12 showed Hg=10.5, MCV=73, Fe=not done> we decided on FeSO4 325mg /d w/ Vit C 500mg /d ~  Labs 12/12 showed Hg= 12.5, MCV=85, & she remains on Fe daily... ~  Presented 1/14 w/ extreme pallor, weakness, dyspnea; Hg=4.2, Fe<10, adm by [facility]=> transfused, oral Iron supplementation, & Hg improved to 9.4 at disch. ~  2/14: pt returns for post hosp visit> feeling better s/p treatment for diverticulitis & transfusion for anemia; labs today showed Hg= 11.9, MCV=74, Fe=30 ~  4/14: labs today showed Hg= 11.3 and Fe= 15.  Will set up for IV iron infusion at short stay => given 1000mg  IV Iron Dextran 5/14... ~  Labs 7/14 showed CBC- Hg=10.1, MCV=78;  Fe= 17 (8%sat);  Rec- monthly IV IRON DEXTRAN 1000mg  Qmo x 3 months w/ rov & labs after that... ~  She has received monthly Iron Dextran infusions of 1000mg  each in Aug, Sept, Oct, & Nov; we will sched another for Dec & f/u w/ labs 1/15...  VITAMIN B12 Deficiency >> asked to take B12 OTC oral supplement ~  Labs in hosp 9/11 showed Vit B12 level = 188; she was immed started on shots by the hospitalists; f/u B12 level 10/11 was 765 & DrLowne planned monthly shots but she never showed up... ~  Labs here 8/12 showed B12 level = 242 and we decided to supplement orally daily for now... ~  Labs 1/14 in hosp showed B12 level = 628; Rec to continue oral supplement...   Past Surgical History  Procedure Laterality Date  . Appendectomy  1950;s  . Right inguinal hernia repair    . Lumbar laminectomy  1989    Dr, Simonne Come  . Laparoscopic cholecystectomy  1992    Dr. Orson Slick  . Right breast lumpectomy/sentinel node bx  06/2005    Dr. Jamey Ripa  . Colonoscopy  03/05/2012    Procedure: COLONOSCOPY;  Surgeon: Barrie Folk, MD;  Location: [facility] ENDOSCOPY;   Service: Endoscopy;  Laterality: N/A;  . Esophagogastroduodenoscopy  03/05/2012    Procedure: ESOPHAGOGASTRODUODENOSCOPY (EGD);  Surgeon: Barrie Folk, MD;  Location:  [facility] ENDOSCOPY;  Service: Endoscopy;  Laterality: N/A;    Outpatient Encounter Prescriptions as of 12/24/2012  Medication Sig  . Calcium Carbonate-Vitamin D (CALTRATE 600+D) 600-400 MG-UNIT per tablet Take 1 tablet by mouth 2 (two) times daily.    . cholecalciferol (VITAMIN D) 1000 UNITS tablet Take 1,000 Units by mouth daily.    Marland Kitchen donepezil (ARICEPT) 10 MG tablet Take 1 tablet (10 mg total) by mouth at bedtime.  . ferrous sulfate 325 (65 FE) MG tablet Take 325 mg by mouth daily with breakfast.  . Fluticasone-Salmeterol (ADVAIR) 500-50 MCG/DOSE AEPB Inhale 1 puff into the lungs as needed.   Marland Kitchen guaiFENesin (MUCINEX) 600 MG 12 hr tablet Take 600 mg by mouth 2 (two) times daily.  . metoprolol (LOPRESSOR) 50 MG tablet Take 1/2 tablet by mouth two times daily  . omeprazole (PRILOSEC) 20 MG capsule TAKE ONE CAPSULE BY MOUTH EVERY DAY  . ondansetron (ZOFRAN) 4 MG tablet Take 1 tablet (4 mg total) by mouth every 8 (eight) hours as needed for nausea.  . predniSONE (DELTASONE) 20 MG tablet Take 1 tablet (20 mg total) by mouth daily.  . traMADol (ULTRAM) 50 MG tablet TAKE 1 TABLET (50 MG TOTAL) BY MOUTH 3 (THREE) TIMES DAILY AS NEEDED FOR PAIN.  Marland Kitchen vitamin B-12 (CYANOCOBALAMIN) 1000 MCG tablet Take 1,000 mcg by mouth daily.  . megestrol (MEGACE) 40 MG/ML suspension Take 5 mLs (200 mg total) by mouth 4 (four) times daily.    Allergies  Allergen Reactions  . Levofloxacin     REACTION: dizziness    Current Medications, Allergies, Past Medical History, Past Surgical History, Family History, and Social History were reviewed in [employer] record.   Review of Systems       See HPI - all other systems neg except as noted...       The patient complains of some weakness, & dyspnea on exertion.  The patient denies  anorexia, fever, weight loss, weight gain, vision loss, decreased hearing, hoarseness, chest pain, syncope, peripheral edema, prolonged cough, headaches, hemoptysis, abdominal pain, melena, hematochezia, severe indigestion/heartburn, hematuria, incontinence, muscle weakness, suspicious skin lesions, transient blindness, difficulty walking, depression, unusual weight change, abnormal bleeding, enlarged lymph nodes, and angioedema.   Objective:   Physical Exam    WD, thin & sl pale, 79 y/o WF in NAD... she is chronically ill appearing. GENERAL:  Alert & mostly cooperative, obvious dementia/ some confusion. HEENT:  [place]/AT, EOM-wnl, PERRLA, EACs-clear, TMs-wnl, NOSE- sl congested, THROAT-clear & wnl. NECK:  Supple w/ fairROM; no JVD; normal carotid impulses w/o bruits; no thyromegaly or nodules palpated; no lymphadenopathy. CHEST:  Clear to P & A; without wheezes or rales- few rhonchi scattered bilat... HEART:  Regular Rhythm; without murmurs/ rubs/ or gallops heard... ABDOMEN:  Soft & nontender; normal bowel sounds; no organomegaly or masses detected. EXT: without deformities, mild arthritic changes; no varicose veins/ +venous insuffic/ no edema. NEURO:  CN's intact; motor testing normal; sensory testing normal; gait normal & balance OK. DERM:  No lesions noted; no rash etc...  MMSE 10/02/10 >> Date 2012 Aug ?day; she didn't know the Pres/ VP/ Gov/ or prev administration; remembered 3/3 objects immed but 0/3 after distraction; serial 7's= [id number]?; WORLD backwards= DLROW; proverbs were mixed- one abstracted (glass houses), others concrete (stitch, etc)...  RADIOLOGY DATA:  Reviewed in the EPIC EMR & discussed w/ the patient & daughter...  LABORATORY DATA:  Reviewed in the EPIC EMR & discussed w/ the patient & daughter...   Assessment &  Plan:    This continues to be a very difficult medical situation> Breathing, BP, CV- all seem to be stable, but she has poor appetite, poor intake, wt loss,  prot-cal malnutrition, musc wasting, poor mobility to the point that she can't/ won't get up, wears depends, etc;  For her part pt is c/o hurting all over w/ pain on passive ROM of legs, won't get out of wheelchair etc;  She does not want more aggressive eval & Rx preferring comfort measures as she is in a lot of pain;  We decided to check labs (Sed=96) & give her a trial PREDNISONE 20mg /d...    Asthma> this has not been an issue but had RLL atx & effusion in past; she has been maintained on Advair, Mucinex, & we prev added IS etc...  HBP> BP is OK on Metop50- 1/2 Bid...  HH seen on CXR & EGD 1/14> may be source of GI blood loss & her iron defic anemia; on OMEPRAZOLE 20mg /d at present...  IBS, Divertics w/ stricture in sigmoid>  See 2005 hosp for diverticulitis, then subseq work up by [facility] in 2006;  Subseq adm 1/14 for severe IDA & eval including Colonoscopy attempt & BE showed diverticulitis in sigmoid w/ narrowed segment etc;  Clinically improved w/ Cipro/Flagyl Rx; CT Abd&Pelvis as noted above...  BREAST CANCER>  Stage 1 dis w/ Lumpectomy, sentinel node bx, XRT, then Arimidex per DrRubin; she prev stopped the Arimidex & stopped her f/u w/ Oncology...  Osteopenia & ?compression fx ~L1 on CXR> off Alendronate again, (f/u BMD 2/13 w/ TScore -2.9 in left FemNeck & Alendronate restarted)... She also has severe degen arthritis in right hip & we discussed trial TRAMADOL + TYLENOL for the pain...  Hx Anemia> ?etiology (never determined w/ certainty) but most likely her mod HH seen on CXR (prev EGD/ Colon 2006 showed smHH, divertics w/ stricture, no lesions seen); tried on oral iron therapy but not responding, therefore started on IV Iron infusions;  Iron has remained low despite IV Iron infusions; Hg and MCV has improved; she has not wanted more aggressive eval; we will continue IV Fe & check stool for occult blood...  Vit B12 defic> it was 188 in hosp 9/11 & started on B12 shots immed by the  hospitalists; f/u level 765 shortly after disch but she never ret for continued shots; B12 level now 242 & we discussed trial of oral supplementation taking po daily...  Dementia> she is seeing DrPlovsky for Psyche> on Aricept 10mg /d...  Other medical problems as noted> aske to continue Calcium, Women's MVI, Vit D 1000u daily & B-12 1041mcg/d oral supplements...   Patient's Medications  New Prescriptions   No medications on file  Previous Medications   CALCIUM CARBONATE-VITAMIN D (CALTRATE 600+D) 600-400 MG-UNIT PER TABLET    Take 1 tablet by mouth 2 (two) times daily.     CHOLECALCIFEROL (VITAMIN D) 1000 UNITS TABLET    Take 1,000 Units by mouth daily.     DONEPEZIL (ARICEPT) 10 MG TABLET    Take 1 tablet (10 mg total) by mouth at bedtime.   FERROUS SULFATE 325 (65 FE) MG TABLET    Take 325 mg by mouth daily with breakfast.   FLUTICASONE-SALMETEROL (ADVAIR) 500-50 MCG/DOSE AEPB    Inhale 1 puff into the lungs as needed.    GUAIFENESIN (MUCINEX) 600 MG 12 HR TABLET    Take 600 mg by mouth 2 (two) times daily.   MEGESTROL (MEGACE) 40 MG/ML SUSPENSION  Take 5 mLs (200 mg total) by mouth 4 (four) times daily.   METOPROLOL (LOPRESSOR) 50 MG TABLET    Take 1/2 tablet by mouth two times daily   OMEPRAZOLE (PRILOSEC) 20 MG CAPSULE    TAKE ONE CAPSULE BY MOUTH EVERY DAY   ONDANSETRON (ZOFRAN) 4 MG TABLET    Take 1 tablet (4 mg total) by mouth every 8 (eight) hours as needed for nausea.   PREDNISONE (DELTASONE) 20 MG TABLET    Take 1 tablet (20 mg total) by mouth daily.   TRAMADOL (ULTRAM) 50 MG TABLET    TAKE 1 TABLET (50 MG TOTAL) BY MOUTH 3 (THREE) TIMES DAILY AS NEEDED FOR PAIN.   VITAMIN B-12 (CYANOCOBALAMIN) 1000 MCG TABLET    Take 1,000 mcg by mouth daily.  Modified Medications   No medications on file  Discontinued Medications   No medications on file

## 2012-12-24 NOTE — Patient Instructions (Signed)
Today we updated your med list in our EPIC system...    Continue your current medications the same...  We decided to continue the PREDNISONE at 20mg  /d for now...  We will arrange for an Orthopedic assessment to see if there is anything that they can do for your Knees etc...  You have an IRON infusion sched for 11/20 at 9AM & we will set up another infusion toward the end of December too...  Then we will recheck you here in mid Jan2015 w/ lab work...  Call for any questions.Marland KitchenMarland Kitchen

## 2012-12-26 ENCOUNTER — Other Ambulatory Visit: Payer: Self-pay | Admitting: Pulmonary Disease

## 2012-12-30 ENCOUNTER — Encounter (HOSPITAL_COMMUNITY): Payer: Medicare Other

## 2012-12-30 ENCOUNTER — Encounter (HOSPITAL_COMMUNITY): Payer: Self-pay

## 2012-12-30 ENCOUNTER — Encounter (HOSPITAL_COMMUNITY)
Admission: RE | Admit: 2012-12-30 | Discharge: 2012-12-30 | Disposition: A | Discharge status: Home / Self Care | Payer: Medicare Other | Source: Ambulatory Visit | Attending: Pulmonary Disease | Admitting: Pulmonary Disease

## 2012-12-30 DIAGNOSIS — D508 Other iron deficiency anemias: Secondary | ICD-10-CM | POA: Insufficient documentation

## 2012-12-30 MED ORDER — SODIUM CHLORIDE 0.9 % IV SOLN
Freq: Once | INTRAVENOUS | Status: AC
  Administered 2012-12-30: 20 mL/h via INTRAVENOUS

## 2012-12-30 MED ORDER — SODIUM CHLORIDE 0.9 % IV SOLN
1000.0000 mg | Freq: Once | INTRAVENOUS | Status: AC
  Administered 2012-12-30: 1000 mg via INTRAVENOUS
  Filled 2012-12-30: qty 20

## 2013-01-20 ENCOUNTER — Telehealth: Payer: Self-pay | Admitting: Pulmonary Disease

## 2013-01-20 DIAGNOSIS — Z515 Encounter for palliative care: Secondary | ICD-10-CM

## 2013-01-20 NOTE — Telephone Encounter (Signed)
[name], Hospice,  returned call & stated when returning call tomorrow, please ask for referrals.  That will get you to the correct area to discuss this.  [name]

## 2013-01-20 NOTE — Telephone Encounter (Signed)
Spoke with on-call receptionist with Hospice. They are needing an order to complete the evaluation for Hospice placement. Pt's daughter has contacted hospice & would like to have hospice care for her. Aware that our office closes at 530p-- will call our office back in the AM.  Will forward to Leigh to follow up on.

## 2013-01-21 NOTE — Telephone Encounter (Signed)
i called and spoke with yvette. Made aware of plan and referral placed. Nothing further needed

## 2013-01-21 NOTE — Telephone Encounter (Signed)
SN, please advise if you are ok to place referral to Hospice for this pt?  If you are ok with this, will you be the hospice attending and would you like for hospice mds to help with symptom management?

## 2013-01-21 NOTE — Telephone Encounter (Signed)
Per SN-yes place order for for referral to Hospice and okay to have hospice MD's help with symptom management; SN to be attending.

## 2013-01-25 ENCOUNTER — Telehealth: Payer: Self-pay | Admitting: Pulmonary Disease

## 2013-01-25 NOTE — Telephone Encounter (Signed)
Spoke with Varnita with Hospice and made aware of ok to d/c meds as listed.

## 2013-01-25 NOTE — Telephone Encounter (Signed)
Per SN---  Ok to d/c the megace, prednisone and aricept.  thanks

## 2013-01-25 NOTE — Telephone Encounter (Signed)
Spoke with pt's hospice nurse. [name] states that the reason the pt wants to d/c prednisone and megace is that she has not had them in a month. The pt's daughter stopped these meds because she felt like they weren't helping. Pt was admitted into hospice under the dx of Alzheimer's, so when this happens they don't tend to give pt meds that treat this diagnosis. That is the reason they are wanting to d/c Aricpet.  SN - please advise on discontinuation of medications.

## 2013-02-08 ENCOUNTER — Encounter: Payer: Self-pay | Admitting: Pulmonary Disease

## 2013-02-11 IMAGING — CR DG CHEST 2V
1 series · 1 of 1 positions shown · non-contrast
Comparison: August 18, 2005 and June 25, 2005

CLINICAL DATA: Asthma, congestion, hypertension, smoker

CHEST - 2 VIEW

[view not recorded]
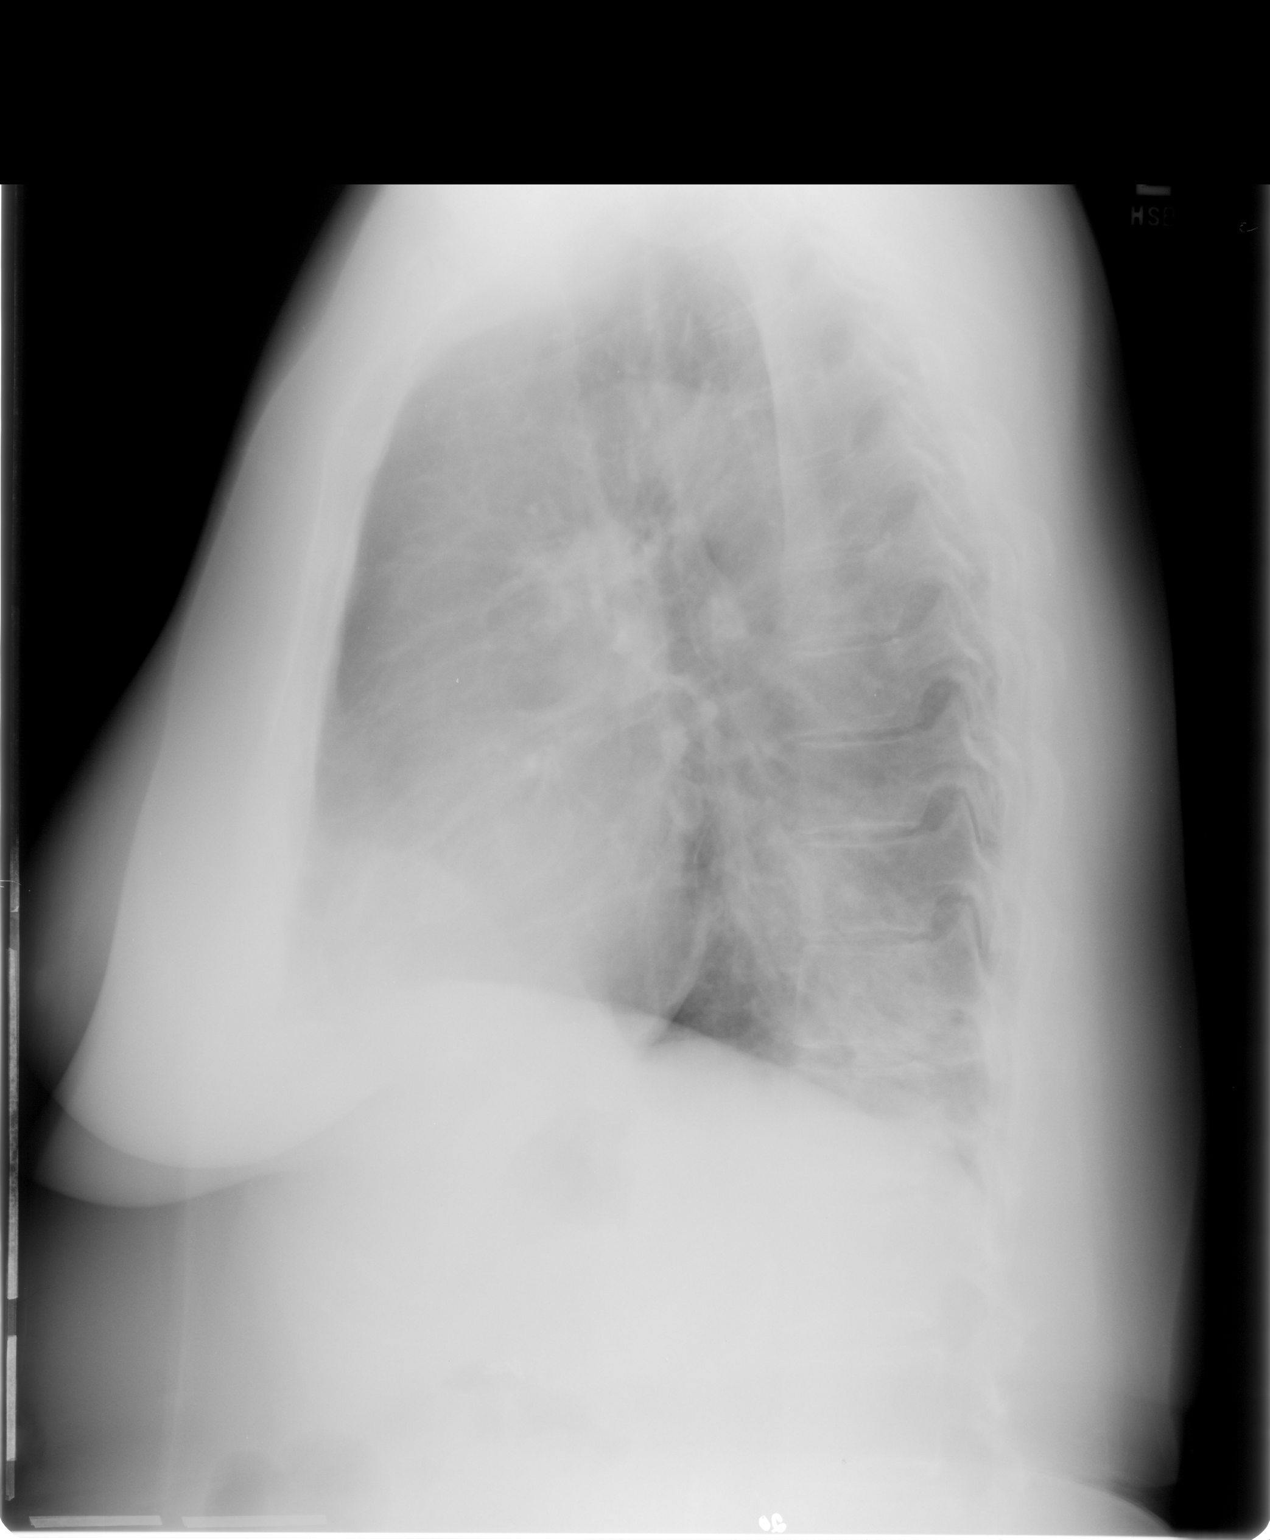

[1 of 1 positions shown; findings below may reference images not displayed]

FINDINGS: The cardiac silhouette, mediastinum, pulmonary
vasculature are within normal limits.  There is a fairly large
hiatal hernia.  No focal infiltrates or effusions are identified.
Left basilar atelectasis/ scarring appears stable.  There is mild
anterior wedging of a lower thoracic vertebral body now.
IMPRESSION: Fairly large hiatal hernia.

Mild anterior wedging of a lower thoracic vertebral body.

## 2013-02-22 ENCOUNTER — Ambulatory Visit: Payer: Medicare Other | Admitting: Pulmonary Disease

## 2013-02-24 ENCOUNTER — Telehealth: Payer: Self-pay | Admitting: Pulmonary Disease

## 2013-02-24 NOTE — Telephone Encounter (Signed)
(  continued)  Pt has weak, congested cough.  Pt was given tylenol.  Following hospice protocols.  FYI continuing to monitor.  [name]

## 2013-02-25 MED ORDER — TRAMADOL HCL 50 MG PO TABS: ORAL_TABLET | ORAL | Status: DC

## 2013-02-25 NOTE — Telephone Encounter (Signed)
[name] called back and she is aware that the rx for the tramadol will be faxed over once signed by SN.  She wanted to give an update on the pt:  She stated that the pt did not have a temp today (98.6) but [name] stated that the pt started to feel warm.  She has been taking the tylenol as needed for the fever.  [name] explained that the pt could have UTI or some type of infection that is going on, but she stated that the family did not want anything diagnostic or abx at this time.  They will cont to watch the pt over the weekend and will call for anything that is needed.

## 2013-02-25 NOTE — Telephone Encounter (Signed)
rx has been printed out and will be faxed to the pharmacy. i called and lmom for Cristela Blue to make her aware.

## 2013-03-07 ENCOUNTER — Telehealth: Payer: Self-pay | Admitting: Pulmonary Disease

## 2013-03-07 NOTE — Telephone Encounter (Signed)
Please advise leigh if you have this? thanks

## 2013-03-08 NOTE — Telephone Encounter (Signed)
Called and spoke with Amy from Hospice and she is aware that form has been faxed back.  Form has been placed in the scan folder.

## 2013-03-28 ENCOUNTER — Other Ambulatory Visit: Payer: Self-pay | Admitting: Pulmonary Disease

## 2013-05-18 ENCOUNTER — Telehealth: Payer: Self-pay | Admitting: Pulmonary Disease

## 2013-05-18 NOTE — Telephone Encounter (Signed)
i have faxed order over. Nothing further needed

## 2013-05-18 NOTE — Telephone Encounter (Signed)
Called spoke with amy from hospice. She reports she has faxed [place] 5 times for a plan of care for date 3/16-6/13 per Amy. Please advise Marliss Czar if this has been received? thanks

## 2013-05-18 NOTE — Telephone Encounter (Signed)
Forms have been signed by SN and sent back up front in folder.  thanks

## 2013-05-25 ENCOUNTER — Telehealth: Payer: Self-pay | Admitting: Pulmonary Disease

## 2013-05-25 ENCOUNTER — Encounter: Payer: Self-pay | Admitting: *Deleted

## 2013-05-25 MED ORDER — AMOXICILLIN-POT CLAVULANATE 875-125 MG PO TABS
1.0000 | ORAL_TABLET | Freq: Two times a day (BID) | ORAL | Status: AC
Start: 2013-05-25 — End: ?

## 2013-05-25 NOTE — Telephone Encounter (Signed)
Called spoke with Onyeje. Made aware of recs. RX has been sent to [facility]. Nothing further needed

## 2013-05-25 NOTE — Telephone Encounter (Signed)
Per SN---  Continue oxygen 2 liters Add mucinex 600 mg  2 po bid Call in augmentin 875 mg 1 po bid otc align once daily

## 2013-05-25 NOTE — Telephone Encounter (Signed)
Spoke with Onyeje. She reports pt O2 sats yesterday was 64% RA. She started pt on O2 2 liters. Today she was at 90%. Pt has a wet cough, rhonchi throughout. She has tussionex and has been taking this. Requesting further recs. Please advise SN thanks  Allergies  Allergen Reactions  . Levofloxacin     REACTION: dizziness

## 2013-05-26 ENCOUNTER — Telehealth: Payer: Self-pay | Admitting: Pulmonary Disease

## 2013-05-26 NOTE — Telephone Encounter (Signed)
Called spoke with Amy. She still did not receive the orders we faxed to her. It is not in pt chart. She willr efax this over so we can have them signed and refaxed. nothingf urther needed

## 2013-05-26 NOTE — Telephone Encounter (Signed)
lmomtcb x1 for amy 

## 2013-05-26 NOTE — Telephone Encounter (Signed)
Return call # [phone]

## 2013-05-27 ENCOUNTER — Other Ambulatory Visit: Payer: Self-pay | Admitting: Pulmonary Disease

## 2013-05-31 ENCOUNTER — Telehealth: Payer: Self-pay | Admitting: Pulmonary Disease

## 2013-05-31 NOTE — Telephone Encounter (Signed)
Called and spoke with wendy from hospice and she stated that the pt is now bed bound and not really able to establish with a new primary care doctor.  Ok to keep SN as the attending MD and have the hospice MD do the symptom management. Nothing further is needed.

## 2013-06-27 ENCOUNTER — Telehealth: Payer: Self-pay | Admitting: Pulmonary Disease

## 2013-06-27 MED ORDER — OXYCODONE HCL ER 10 MG PO T12A: 10.0000 mg | EXTENDED_RELEASE_TABLET | Freq: Two times a day (BID) | ORAL | Status: AC

## 2013-06-27 NOTE — Telephone Encounter (Signed)
Called spoke with hospice nurse Onjeye. She reports pt pain is not controlled with the tramadol. She spoke with Dr. Lyman Speller and he rec we send in RX for Oxy SR 1 po q12hrs. Please advise SN thanks  --[facility] [address]

## 2013-06-27 NOTE — Telephone Encounter (Signed)
Per SN---  Ok we can send in the oxycodone 10 mg  1 po bid.  #60.  This rx has been printed out and signed by SN. [facility] [address].  Called and spoke with onjeye from hospice and she is aware that this rx has been faxed in.  Nothing further is needed.

## 2013-07-01 ENCOUNTER — Telehealth: Payer: Self-pay | Admitting: Pulmonary Disease

## 2013-07-01 NOTE — Telephone Encounter (Signed)
Hospice nurse returned call.  Satira Anis

## 2013-07-01 NOTE — Telephone Encounter (Signed)
Spoke with the hospice nurse She states that the rx we sent was wrong  Pharmacy told her that they received call from [place] for 15 mg tablets  I see no record of this  [facility] and spoke with Marylyn Ishihara He states that Dr. Lyman Speller called in the 15 mg on 06/30/13  Pharmacy states that they never received the fax that we sent  If Dr Lyman Speller is going to be prescribing this, they why is SN??? LMTCB for nurse

## 2013-07-01 NOTE — Telephone Encounter (Signed)
I called onjeye and made her aware. Nothing further needed

## 2013-09-26 ENCOUNTER — Telehealth: Payer: Self-pay | Admitting: Pulmonary Disease

## 2013-09-26 NOTE — Telephone Encounter (Signed)
SN is aware. 

## 2013-09-26 NOTE — Telephone Encounter (Signed)
Will forward to SN as an [place]

## 2013-09-29 ENCOUNTER — Encounter: Payer: Self-pay | Admitting: Family Medicine

## 2013-10-11 DEATH — deceased
